# Patient Record
Sex: Male | Born: 1950 | Race: White | Hispanic: No | Marital: Married | Smoking: Never smoker
Health system: Southern US, Community
[De-identification: ages and names within clinical notes are randomized; demographics above are authoritative.]

## PROBLEM LIST (undated history)

## (undated) DIAGNOSIS — I2699 Other pulmonary embolism without acute cor pulmonale: Secondary | ICD-10-CM

## (undated) DIAGNOSIS — R350 Frequency of micturition: Secondary | ICD-10-CM

## (undated) DIAGNOSIS — N323 Diverticulum of bladder: Secondary | ICD-10-CM

## (undated) DIAGNOSIS — N19 Unspecified kidney failure: Secondary | ICD-10-CM

## (undated) HISTORY — PX: HERNIA REPAIR: SHX51

## (undated) HISTORY — PX: OTHER SURGICAL HISTORY: SHX169

---

## 1969-08-20 HISTORY — PX: OTHER SURGICAL HISTORY: SHX169

## 1974-08-20 HISTORY — PX: HIP SURGERY: SHX245

## 2003-03-31 ENCOUNTER — Encounter: Payer: Self-pay | Admitting: Family Medicine

## 2003-03-31 ENCOUNTER — Encounter: Admission: RE | Admit: 2003-03-31 | Discharge: 2003-03-31 | Payer: Self-pay | Admitting: Family Medicine

## 2006-07-01 ENCOUNTER — Encounter: Admission: RE | Admit: 2006-07-01 | Discharge: 2006-07-01 | Payer: Self-pay | Admitting: Family Medicine

## 2007-09-25 ENCOUNTER — Ambulatory Visit: Payer: Self-pay | Admitting: Gastroenterology

## 2007-10-10 ENCOUNTER — Ambulatory Visit: Payer: Self-pay | Admitting: Gastroenterology

## 2007-10-10 ENCOUNTER — Encounter: Payer: Self-pay | Admitting: Gastroenterology

## 2008-05-06 ENCOUNTER — Encounter: Admission: RE | Admit: 2008-05-06 | Discharge: 2008-05-06 | Payer: Self-pay | Admitting: Family Medicine

## 2012-05-27 ENCOUNTER — Other Ambulatory Visit: Payer: Self-pay | Admitting: Urology

## 2012-06-04 ENCOUNTER — Encounter (HOSPITAL_BASED_OUTPATIENT_CLINIC_OR_DEPARTMENT_OTHER): Payer: Self-pay | Admitting: *Deleted

## 2012-06-04 NOTE — Progress Notes (Signed)
NPO AFTER MN. ARRIVES AT 0800. NEEDS HG. 

## 2012-06-05 ENCOUNTER — Ambulatory Visit (HOSPITAL_BASED_OUTPATIENT_CLINIC_OR_DEPARTMENT_OTHER)
Admission: RE | Admit: 2012-06-05 | Discharge: 2012-06-05 | Disposition: A | Discharge status: Home / Self Care | Payer: 59 | Source: Ambulatory Visit | Attending: Urology | Admitting: Urology

## 2012-06-05 ENCOUNTER — Ambulatory Visit (HOSPITAL_BASED_OUTPATIENT_CLINIC_OR_DEPARTMENT_OTHER): Payer: 59 | Admitting: Anesthesiology

## 2012-06-05 ENCOUNTER — Encounter (HOSPITAL_BASED_OUTPATIENT_CLINIC_OR_DEPARTMENT_OTHER): Payer: Self-pay | Admitting: *Deleted

## 2012-06-05 ENCOUNTER — Encounter (HOSPITAL_BASED_OUTPATIENT_CLINIC_OR_DEPARTMENT_OTHER): Payer: Self-pay | Admitting: Anesthesiology

## 2012-06-05 ENCOUNTER — Encounter (HOSPITAL_BASED_OUTPATIENT_CLINIC_OR_DEPARTMENT_OTHER)
Admission: RE | Disposition: A | Discharge status: Home / Self Care | Payer: Self-pay | Source: Ambulatory Visit | Attending: Urology

## 2012-06-05 DIAGNOSIS — N183 Chronic kidney disease, stage 3 unspecified: Secondary | ICD-10-CM | POA: Insufficient documentation

## 2012-06-05 DIAGNOSIS — R339 Retention of urine, unspecified: Secondary | ICD-10-CM | POA: Insufficient documentation

## 2012-06-05 DIAGNOSIS — N401 Enlarged prostate with lower urinary tract symptoms: Secondary | ICD-10-CM | POA: Insufficient documentation

## 2012-06-05 DIAGNOSIS — N400 Enlarged prostate without lower urinary tract symptoms: Secondary | ICD-10-CM

## 2012-06-05 DIAGNOSIS — Z79899 Other long term (current) drug therapy: Secondary | ICD-10-CM | POA: Insufficient documentation

## 2012-06-05 HISTORY — PX: CYSTOSCOPY: SHX5120

## 2012-06-05 HISTORY — DX: Frequency of micturition: R35.0

## 2012-06-05 HISTORY — DX: Diverticulum of bladder: N32.3

## 2012-06-05 LAB — BASIC METABOLIC PANEL
BUN: 27 mg/dL — ABNORMAL HIGH (ref 6–23)
Chloride: 104 mEq/L (ref 96–112)
Glucose, Bld: 84 mg/dL (ref 70–99)
Potassium: 3.7 mEq/L (ref 3.5–5.1)

## 2012-06-05 SURGERY — CYSTOSCOPY
Anesthesia: General | Site: Bladder | Wound class: Clean Contaminated

## 2012-06-05 MED ORDER — LIDOCAINE HCL 2 % EX GEL
CUTANEOUS | Status: DC | PRN
  Administered 2012-06-05: 1 via URETHRAL

## 2012-06-05 MED ORDER — DEXAMETHASONE SODIUM PHOSPHATE 4 MG/ML IJ SOLN
INTRAMUSCULAR | Status: DC | PRN
  Administered 2012-06-05: 8 mg via INTRAVENOUS

## 2012-06-05 MED ORDER — LACTATED RINGERS IV SOLN: INTRAVENOUS | Status: DC

## 2012-06-05 MED ORDER — MIDAZOLAM HCL 5 MG/5ML IJ SOLN
INTRAMUSCULAR | Status: DC | PRN
  Administered 2012-06-05: 2 mg via INTRAVENOUS

## 2012-06-05 MED ORDER — LIDOCAINE HCL (CARDIAC) 20 MG/ML IV SOLN
INTRAVENOUS | Status: DC | PRN
  Administered 2012-06-05: 80 mg via INTRAVENOUS

## 2012-06-05 MED ORDER — DIATRIZOATE MEGLUMINE 30 % UR SOLN
URETHRAL | Status: DC | PRN
  Administered 2012-06-05: 300 mL via URETHRAL

## 2012-06-05 MED ORDER — CEFAZOLIN SODIUM 1-5 GM-% IV SOLN: 1.0000 g | INTRAVENOUS | Status: DC

## 2012-06-05 MED ORDER — BELLADONNA ALKALOIDS-OPIUM 16.2-60 MG RE SUPP
RECTAL | Status: DC | PRN
  Administered 2012-06-05: 1 via RECTAL

## 2012-06-05 MED ORDER — STERILE WATER FOR IRRIGATION IR SOLN
Status: DC | PRN
  Administered 2012-06-05: 3000 mL

## 2012-06-05 MED ORDER — ONDANSETRON HCL 4 MG/2ML IJ SOLN
INTRAMUSCULAR | Status: DC | PRN
  Administered 2012-06-05: 4 mg via INTRAVENOUS

## 2012-06-05 MED ORDER — FENTANYL CITRATE 0.05 MG/ML IJ SOLN
INTRAMUSCULAR | Status: DC | PRN
  Administered 2012-06-05: 50 ug via INTRAVENOUS
  Administered 2012-06-05 (×2): 25 ug via INTRAVENOUS

## 2012-06-05 MED ORDER — PROPOFOL 10 MG/ML IV BOLUS
INTRAVENOUS | Status: DC | PRN
  Administered 2012-06-05: 200 mg via INTRAVENOUS

## 2012-06-05 MED ORDER — HYDROMORPHONE HCL PF 1 MG/ML IJ SOLN: 0.2500 mg | INTRAMUSCULAR | Status: DC | PRN

## 2012-06-05 MED ORDER — LACTATED RINGERS IV SOLN
INTRAVENOUS | Status: DC
  Administered 2012-06-05 (×2): via INTRAVENOUS

## 2012-06-05 MED ORDER — PROMETHAZINE HCL 25 MG/ML IJ SOLN: 6.2500 mg | INTRAMUSCULAR | Status: DC | PRN

## 2012-06-05 MED ORDER — CEFAZOLIN SODIUM-DEXTROSE 2-3 GM-% IV SOLR
2.0000 g | INTRAVENOUS | Status: AC
  Administered 2012-06-05: 2 g via INTRAVENOUS

## 2012-06-05 SURGICAL SUPPLY — 15 items
BAG DRAIN URO-CYSTO SKYTR STRL (DRAIN) ×2 IMPLANT
BAG DRN UROCATH (DRAIN) ×1
BOOTIES KNEE HIGH SLOAN (MISCELLANEOUS) ×2 IMPLANT
CANISTER SUCT LVC 12 LTR MEDI- (MISCELLANEOUS) ×1 IMPLANT
CLOTH BEACON ORANGE TIMEOUT ST (SAFETY) ×2 IMPLANT
DRAPE CAMERA CLOSED 9X96 (DRAPES) ×2 IMPLANT
ELECT REM PT RETURN 9FT ADLT (ELECTROSURGICAL) ×2
ELECTRODE REM PT RTRN 9FT ADLT (ELECTROSURGICAL) ×1 IMPLANT
GLOVE BIO SURGEON STRL SZ7.5 (GLOVE) ×2 IMPLANT
GLOVE INDICATOR 6.5 STRL GRN (GLOVE) ×2 IMPLANT
GOWN PREVENTION PLUS LG XLONG (DISPOSABLE) ×2 IMPLANT
GOWN STRL REIN XL XLG (GOWN DISPOSABLE) ×2 IMPLANT
PACK CYSTOSCOPY (CUSTOM PROCEDURE TRAY) ×2 IMPLANT
SYR BULB IRRIGATION 50ML (SYRINGE) ×1 IMPLANT
WATER STERILE IRR 3000ML UROMA (IV SOLUTION) ×2 IMPLANT

## 2012-06-05 NOTE — Anesthesia Preprocedure Evaluation (Signed)
Anesthesia Evaluation  Patient identified by MRN, date of birth, ID band Patient awake    Reviewed: Allergy & Precautions, H&P , NPO status , Patient's Chart, lab work & pertinent test results  Airway Mallampati: II TM Distance: >3 FB Neck ROM: Full    Dental  (+) Teeth Intact and Dental Advisory Given   Pulmonary neg pulmonary ROS,  breath sounds clear to auscultation  Pulmonary exam normal       Cardiovascular negative cardio ROS  Rhythm:Regular Rate:Normal     Neuro/Psych negative neurological ROS  negative psych ROS   GI/Hepatic negative GI ROS, Neg liver ROS,   Endo/Other  negative endocrine ROS  Renal/GU negative Renal ROS  negative genitourinary   Musculoskeletal negative musculoskeletal ROS (+)   Abdominal   Peds  Hematology negative hematology ROS (+)   Anesthesia Other Findings   Reproductive/Obstetrics negative OB ROS                           Anesthesia Physical Anesthesia Plan  ASA: I  Anesthesia Plan: General   Post-op Pain Management:    Induction: Intravenous  Airway Management Planned: LMA  Additional Equipment:   Intra-op Plan:   Post-operative Plan: Extubation in OR  Informed Consent: I have reviewed the patients History and Physical, chart, labs and discussed the procedure including the risks, benefits and alternatives for the proposed anesthesia with the patient or authorized representative who has indicated his/her understanding and acceptance.   Dental advisory given  Plan Discussed with: CRNA  Anesthesia Plan Comments:         Anesthesia Quick Evaluation  

## 2012-06-05 NOTE — Anesthesia Postprocedure Evaluation (Signed)
Anesthesia Post Note  Patient: Brian Kemp  Procedure(s) Performed: Procedure(s) (LRB): CYSTOSCOPY (N/A) CYSTOGRAM (N/A)  Anesthesia type: General  Patient location: PACU  Post pain: Pain level controlled  Post assessment: Post-op Vital signs reviewed  Last Vitals:  Filed Vitals:   06/05/12 1145  BP: 160/80  Pulse: 50  Temp: 36.1 C  Resp: 18    Post vital signs: Reviewed  Level of consciousness: sedated  Complications: No apparent anesthesia complications

## 2012-06-05 NOTE — Anesthesia Procedure Notes (Signed)
Procedure Name: LMA Insertion Date/Time: 06/05/2012 9:26 AM Performed by: Norva Pavlov Pre-anesthesia Checklist: Patient identified, Emergency Drugs available, Suction available and Patient being monitored Patient Re-evaluated:Patient Re-evaluated prior to inductionOxygen Delivery Method: Circle System Utilized Preoxygenation: Pre-oxygenation with 100% oxygen Intubation Type: IV induction Ventilation: Mask ventilation without difficulty LMA: LMA inserted LMA Size: 4.0 Number of attempts: 1 Airway Equipment and Method: bite block Placement Confirmation: positive ETCO2 Tube secured with: Tape Dental Injury: Teeth and Oropharynx as per pre-operative assessment

## 2012-06-05 NOTE — Transfer of Care (Signed)
Immediate Anesthesia Transfer of Care Note  Patient: Brian Kemp  Procedure(s) Performed: Procedure(s) (LRB): CYSTOSCOPY (N/A) CYSTOGRAM (N/A)  Patient Location: PACU  Anesthesia Type: General  Level of Consciousness: awake, alert  and oriented  Airway & Oxygen Therapy: Patient Spontanous Breathing and Patient connected to face mask oxygen  Post-op Assessment: Report given to PACU RN and Post -op Vital signs reviewed and stable  Post vital signs: Reviewed and stable  Complications: No apparent anesthesia complications

## 2012-06-05 NOTE — Interval H&P Note (Signed)
History and Physical Interval Note:  06/05/2012 9:10 AM  Brian Kemp  has presented today for surgery, with the diagnosis of BILATERAL DIVERTICULI  The various methods of treatment have been discussed with the patient and family. After consideration of risks, benefits and other options for treatment, the patient has consented to  Procedure(s) (LRB) with comments: CYSTOSCOPY (N/A) - C-ARM  CYSTOGRAM (N/A) as a surgical intervention .  The patient's history has been reviewed, patient examined, no change in status, stable for surgery.  I have reviewed the patient's chart and labs.  Questions were answered to the patient's satisfaction.     [name]

## 2012-06-05 NOTE — H&P (Signed)
cc:  Dr. Fried   Reason For Visit     Cystoscopy, PUS, RUS & to review urodynamic results   Active Problems Problems  1. 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 Chronic Kidney Disease, Stage 3 585.3 3. Incomplete Emptying Of Bladder 788.21  History of Present Illness           Brian Kemp is a 61-year-old male returns today for cystoscopy, PUS, RUS & to review urodynamic results.     Hx of BPH, incomplete emptying, urgency, frequency, urinary straining, weak flow, and nocturia x3 with symptoms for 15 years. He has an international prostate symptom score= 25 (normal 7).  He also has enuresis. He has previously taken finasteride, but stopped because of side effects ( breast development). He has a creatinine of 1.94, and stage III chronic kidney disease. Rectal examination showed normal sphincter tone with prostate 3+ in size, and tone is normal.  Urodynamics on 05/13/12, shows maximum cystometric capacity of 900 cc. First sensation is at 561 cc, normal desire at 602 cc, strong desire at 707 cc. The bladder has a 14 cm H20contraction pressure,  with no urinary leakage.     VCUG shows that there is a voluntary contraction,  with a voided volume of 211cc, and a maximum flow rate of 9 cc per second. Detrusor pressure at maximum flow was 123 cm H20 water. Maximum detrusor pressure is 124 cm H20. PVR is 689 cc. The patient voided both sitting and standing. He has an obstructive flow pattern.  The patient has trabeculation, bilateral bladder diverticuli. He has a large, hyposensitive detrusor with low amplitude instability at high volumes.   Past Medical History Problems  1. History of  No Medical Problems  Surgical History Problems  1. History of  Eye Surgery Right 2. History of  Hernia Repair 3. History of  Hip Surgery Left 4. History of  Knee Surgery Left 5. History of  Knee Surgery Left  Current Meds 1. Aleve TABS; TAKE TABLET  PRN; Therapy:  (Recorded:17Sep2013) to 2. Ciprofloxacin HCl 500 MG Oral Tablet; TAKE 1 TABLET BID; Therapy: 18Sep2013 to (Last  Rx:02Oct2013)  Requested for: 02Oct2013 3. Multi Vitamin/Minerals TABS; Therapy: (Recorded:17Sep2013) to 4. Tamsulosin HCl 0.4 MG Oral Capsule; Therapy: (Recorded:17Sep2013) to  Allergies Medication  1. No Known Drug Allergies  Family History Problems  1. Paternal history of  Alzheimer's Disease 2. Paternal history of  Cancer 3. Family history of  Death In The Family Father 4. Family history of  Family Health Status Number Of Children 1 daughter  Social History Problems  1. Alcohol Use 1 per month 2. Caffeine Use 2-3 per day 3. Marital History - Divorced V61.03 4. Never A Smoker 5. Retired From Work  Review of Systems Genitourinary, constitutional, skin, eye, otolaryngeal, hematologic/lymphatic, cardiovascular, pulmonary, endocrine, musculoskeletal, gastrointestinal, neurological and psychiatric system(s) were reviewed and pertinent findings if present are noted.  Genitourinary: urinary frequency, feelings of urinary urgency, nocturia, weak urinary stream, urinary stream starts and stops, incomplete emptying of bladder and initiating urination requires straining and enuresis.  Musculoskeletal: joint pain.    Vitals Vital Signs [Data Includes: Last 1 Day]  07Oct2013 04:12PM  Blood Pressure: 141 / 91 Temperature: 97.5 F Heart Rate: 59  Results/Data Urine [Data Includes: Last 1 Day]   07Oct2013  COLOR YELLOW   APPEARANCE CLOUDY   SPECIFIC GRAVITY 1.015   pH 5.5   GLUCOSE NEG mg/dL  BILIRUBIN NEG   KETONE NEG mg/dL  BLOOD MOD     PROTEIN NEG mg/dL  UROBILINOGEN 0.2 mg/dL  NITRITE NEG   LEUKOCYTE ESTERASE LARGE   SQUAMOUS EPITHELIAL/HPF NONE SEEN   WBC TNTC WBC/hpf  RBC 7-10 RBC/hpf  BACTERIA FEW   CRYSTALS NONE SEEN   CASTS NONE SEEN   Other MUCUS PRESENT   Selected Results  URINE CULTURE 24Sep2013 11:13AM Freeda Spivey  Source :  CATHED SPECIMEN TYPE: CATH URINE   Test Name Result Flag Reference  CULTURE, URINE Culture, Urine    ===== COLONY COUNT: =====  NO GROWTH   FINAL REPORT: NO GROWTH     Renal u/s: R: 11.09cm wiht cortex 1.38cm and L: 10.82cm wiht cortex 1.18cm. MODERATE BILATERAL HYDRONEPHROSIS. No stones. No tumor. PVR estimate= 426.53cc. 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  Procedure  Procedure: Cystoscopy   Indication: Lower Urinary Tract Symptoms. Frequent UTI.  Informed Consent: Risks, benefits, and potential adverse events were discussed and informed consent was obtained from the patient.  Prep: The patient was prepped with betadine.  Anesthesia:. Local anesthesia was administered intraurethrally with 2% lidocaine jelly.  Antibiotic prophylaxis: Ciprofloxacin.  Procedure Note:  Anterior urethra: No abnormalities.  Prostatic urethra: No abnormalities . The lateral prostatic lobes were enlarged. No intravesical median lobe was visualized.  Bladder: Visulization was obscured due to cloudy urine. The ureteral orifices were not able to be identified. Examination of the bladder demonstrated trabeculation and a diverticulum located on the posterior aspect, on the left side of the bladder measuring approximately 4 cm bilateral posterior, large, but no clot within the bladder no fistula and no cellules. The patient tolerated the procedure well.  Complications: None.    Assessment Assessed  1. 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 Chronic Kidney Disease, Stage 3 585.3 3. Incomplete Emptying Of Bladder 788.21         61 yo male with bladder outlet symptoms since age 44, with bilateral bladder tics on VCUG, ultrasound, and on cystoscopy; and bilateral hydronephrosis, CKD 3, with elevated Cr. 1.46, and pvr = 426cc. Prostate volume is  39.58cc. I do not think the siae of the prostate is the problem; rather I think he needs cysto under anesthesia with cystogram to look for reflux,  and will present to Dr. MacDiarmid to consider value of bilateral diverticulectomy, but leave prostate alone. He has bilateral hydro, and this may be why he has ckd. ? need for bilateral reflux surgery at the same time. Concern is for high detrussor pressure as cause for CKD.   Plan      Creatinine, cysto and cystogram.   Amendment     prostate u/s: 39.58cc. Renal u/s: R: 11.00 iwth cortex 1.38cm, L: 10.82 and coretx 1.18cm. Bilateral hydrl. pvr> 426.53cc   Signatures Electronically signed by : Carizma Dunsworth, M.D.; May 27 2012  1:51PM  

## 2012-06-05 NOTE — Op Note (Signed)
Pre-operative diagnosis : BPH with chronic kidney disease  Postoperative diagnosis: Same  Operation: Cystourethroscopy, cystogram  Surgeon:  S. Patsi Sears, MD  First assistant: None  [id number]}  Preparation: after appropriate preanesthesia, the patient was brought to the operating room, placed on the operating table in armband was double checked. The patient was then replaced in the dorsal lithotomy position with care taken to avoid any injury to the left hip, which is a source of pain for the patient.the dorsal supine position where general LMA anesthesia was introduced.  Review history:Problems  1. Benign Localized Prostatic Hyperplasia With Urinary Obstruction 600.21  2. Chronic Kidney Disease, Stage 3 585.3  3. Incomplete Emptying Of Bladder 788.21  History of Present Illness  Brian Kemp is a 61 year old male returns today for cystoscopy, PUS, RUS & to review urodynamic results.  Hx of BPH, incomplete emptying, urgency, frequency, urinary straining, weak flow, and nocturia x3 with symptoms for 15 years. He has an international prostate symptom score= 25 (normal 7). He also has enuresis. He has previously taken finasteride, but stopped because of side effects ( breast development). He has a creatinine of 1.94, and stage III chronic kidney disease. Rectal examination showed normal sphincter tone with prostate 3+ in size, and tone is normal.  Urodynamics on 05/13/12, shows maximum cystometric capacity of 900 cc. First sensation is at 561 cc, normal desire at 602 cc, strong desire at 707 cc. The bladder has a 14 cm H20contraction pressure, with no urinary leakage.  VCUG shows that there is a voluntary contraction, with a voided volume of 211cc, and a maximum flow rate of 9 cc per second. Detrusor pressure at maximum flow was 123 cm H20 water. Maximum detrusor pressure is 124 cm H20. PVR is 689 cc. The patient voided both sitting and standing. He has an obstructive flow pattern.  The  patient has trabeculation, bilateral bladder diverticuli. He has a large, hyposensitive detrusor with low amplitude instability at high volumes.   Statement of  Likelihood of success: Good    Procedure:   Cystourethroscopy was accomplished, which showed normal urethral meatus, and normal pendulous urethra. The proximal urethra showed normal bulbar urethra, normal Vero, and bilobar BPH, with normal bladder neck. The bladder base showed a normal trigone, with normal ureteral orifice these. There was no evidence of bladder stone tumor. There were bilateral diverticuli identified, at the lateral bladder base, bilaterally. Further cystoscopy revealed large distended bladder, with overstretched detrusor. Trabeculation and cellule formation were identified.     Cystogram showed no evidence of reflux bilaterally.     The bladder is drained of fluid, and again, no reflux was identified. The bilateral diverticuli were easily identified.     Xylocaine jelly was placed in the urethra, and the patient was awakened, and taken to recovery room in good condition.

## 2012-06-06 ENCOUNTER — Encounter (HOSPITAL_BASED_OUTPATIENT_CLINIC_OR_DEPARTMENT_OTHER): Payer: Self-pay | Admitting: Urology

## 2012-06-25 ENCOUNTER — Other Ambulatory Visit: Payer: Self-pay | Admitting: Urology

## 2012-07-02 ENCOUNTER — Encounter (HOSPITAL_BASED_OUTPATIENT_CLINIC_OR_DEPARTMENT_OTHER): Payer: Self-pay | Admitting: *Deleted

## 2012-07-02 NOTE — Progress Notes (Signed)
NPO AFTER MN. ARRIVES AT 0945. NEEDS HG. 

## 2012-07-07 ENCOUNTER — Ambulatory Visit (HOSPITAL_BASED_OUTPATIENT_CLINIC_OR_DEPARTMENT_OTHER): Payer: 59 | Admitting: Anesthesiology

## 2012-07-07 ENCOUNTER — Encounter (HOSPITAL_BASED_OUTPATIENT_CLINIC_OR_DEPARTMENT_OTHER): Payer: Self-pay | Admitting: Anesthesiology

## 2012-07-07 ENCOUNTER — Encounter (HOSPITAL_BASED_OUTPATIENT_CLINIC_OR_DEPARTMENT_OTHER): Payer: Self-pay | Admitting: *Deleted

## 2012-07-07 ENCOUNTER — Ambulatory Visit (HOSPITAL_BASED_OUTPATIENT_CLINIC_OR_DEPARTMENT_OTHER)
Admission: RE | Admit: 2012-07-07 | Discharge: 2012-07-07 | Disposition: A | Discharge status: Home / Self Care | Payer: 59 | Source: Ambulatory Visit | Attending: Urology | Admitting: Urology

## 2012-07-07 ENCOUNTER — Encounter (HOSPITAL_BASED_OUTPATIENT_CLINIC_OR_DEPARTMENT_OTHER)
Admission: RE | Disposition: A | Discharge status: Home / Self Care | Payer: Self-pay | Source: Ambulatory Visit | Attending: Urology

## 2012-07-07 DIAGNOSIS — N138 Other obstructive and reflux uropathy: Secondary | ICD-10-CM | POA: Insufficient documentation

## 2012-07-07 DIAGNOSIS — N320 Bladder-neck obstruction: Secondary | ICD-10-CM | POA: Insufficient documentation

## 2012-07-07 DIAGNOSIS — N323 Diverticulum of bladder: Secondary | ICD-10-CM | POA: Insufficient documentation

## 2012-07-07 DIAGNOSIS — N401 Enlarged prostate with lower urinary tract symptoms: Secondary | ICD-10-CM | POA: Insufficient documentation

## 2012-07-07 DIAGNOSIS — N183 Chronic kidney disease, stage 3 unspecified: Secondary | ICD-10-CM | POA: Insufficient documentation

## 2012-07-07 DIAGNOSIS — R339 Retention of urine, unspecified: Secondary | ICD-10-CM | POA: Insufficient documentation

## 2012-07-07 DIAGNOSIS — Z79899 Other long term (current) drug therapy: Secondary | ICD-10-CM | POA: Insufficient documentation

## 2012-07-07 HISTORY — PX: TRANSURETHRAL RESECTION OF PROSTATE: SHX73

## 2012-07-07 SURGERY — TRANSURETHRAL RESECTION OF THE PROSTATE WITH GYRUS INSTRUMENTS
Anesthesia: General | Site: Prostate | Wound class: Clean Contaminated

## 2012-07-07 MED ORDER — LIDOCAINE HCL (CARDIAC) 20 MG/ML IV SOLN
INTRAVENOUS | Status: DC | PRN
  Administered 2012-07-07: 80 mg via INTRAVENOUS

## 2012-07-07 MED ORDER — DEXAMETHASONE SODIUM PHOSPHATE 4 MG/ML IJ SOLN
INTRAMUSCULAR | Status: DC | PRN
  Administered 2012-07-07: 10 mg via INTRAVENOUS

## 2012-07-07 MED ORDER — BELLADONNA ALKALOIDS-OPIUM 16.2-60 MG RE SUPP
RECTAL | Status: DC | PRN
Start: 2012-07-07 — End: 2012-07-07
  Administered 2012-07-07: 1 via RECTAL

## 2012-07-07 MED ORDER — PROMETHAZINE HCL 25 MG/ML IJ SOLN
6.2500 mg | INTRAMUSCULAR | Status: DC | PRN
  Filled 2012-07-07: qty 1

## 2012-07-07 MED ORDER — KETOROLAC TROMETHAMINE 30 MG/ML IJ SOLN
INTRAMUSCULAR | Status: DC | PRN
  Administered 2012-07-07: 15 mg via INTRAVENOUS

## 2012-07-07 MED ORDER — ONDANSETRON HCL 4 MG/2ML IJ SOLN
INTRAMUSCULAR | Status: DC | PRN
  Administered 2012-07-07: 4 mg via INTRAVENOUS

## 2012-07-07 MED ORDER — URELLE 81 MG PO TABS: 1.0000 | ORAL_TABLET | Freq: Three times a day (TID) | ORAL | Status: DC

## 2012-07-07 MED ORDER — TRIMETHOPRIM 100 MG PO TABS: 100.0000 mg | ORAL_TABLET | ORAL | Status: DC

## 2012-07-07 MED ORDER — ACETAMINOPHEN 10 MG/ML IV SOLN
INTRAVENOUS | Status: DC | PRN
  Administered 2012-07-07: 1000 mg via INTRAVENOUS

## 2012-07-07 MED ORDER — PROPOFOL 10 MG/ML IV BOLUS
INTRAVENOUS | Status: DC | PRN
  Administered 2012-07-07: 50 mg via INTRAVENOUS
  Administered 2012-07-07: 200 mg via INTRAVENOUS

## 2012-07-07 MED ORDER — OXYCODONE HCL 5 MG PO TABS
5.0000 mg | ORAL_TABLET | ORAL | Status: DC | PRN
  Administered 2012-07-07: 5 mg via ORAL
  Filled 2012-07-07: qty 1

## 2012-07-07 MED ORDER — FENTANYL CITRATE 0.05 MG/ML IJ SOLN
INTRAMUSCULAR | Status: DC | PRN
  Administered 2012-07-07 (×3): 50 ug via INTRAVENOUS
  Administered 2012-07-07: 25 ug via INTRAVENOUS
  Administered 2012-07-07: 50 ug via INTRAVENOUS
  Administered 2012-07-07: 25 ug via INTRAVENOUS
  Administered 2012-07-07: 50 ug via INTRAVENOUS

## 2012-07-07 MED ORDER — MIDAZOLAM HCL 5 MG/5ML IJ SOLN
INTRAMUSCULAR | Status: DC | PRN
  Administered 2012-07-07: 2 mg via INTRAVENOUS

## 2012-07-07 MED ORDER — LACTATED RINGERS IV SOLN
INTRAVENOUS | Status: DC
  Filled 2012-07-07: qty 1000

## 2012-07-07 MED ORDER — CEFAZOLIN SODIUM 1-5 GM-% IV SOLN
1.0000 g | INTRAVENOUS | Status: DC
  Filled 2012-07-07: qty 50

## 2012-07-07 MED ORDER — HYDROMORPHONE HCL PF 1 MG/ML IJ SOLN
0.2500 mg | INTRAMUSCULAR | Status: DC | PRN
  Filled 2012-07-07: qty 1

## 2012-07-07 MED ORDER — CEFAZOLIN SODIUM-DEXTROSE 2-3 GM-% IV SOLR
2.0000 g | INTRAVENOUS | Status: AC
  Administered 2012-07-07: 2 g via INTRAVENOUS
  Filled 2012-07-07: qty 50

## 2012-07-07 MED ORDER — SODIUM CHLORIDE 0.9 % IR SOLN
Status: DC | PRN
  Administered 2012-07-07: 12000 mL

## 2012-07-07 MED ORDER — LACTATED RINGERS IV SOLN
INTRAVENOUS | Status: DC
  Administered 2012-07-07 (×2): via INTRAVENOUS
  Filled 2012-07-07: qty 1000

## 2012-07-07 MED ORDER — INDIGOTINDISULFONATE SODIUM 8 MG/ML IJ SOLN
INTRAMUSCULAR | Status: DC | PRN
  Administered 2012-07-07: 5 mL via INTRAVENOUS

## 2012-07-07 SURGICAL SUPPLY — 27 items
BAG DRAIN URO-CYSTO SKYTR STRL (DRAIN) ×2 IMPLANT
BAG DRN UROCATH (DRAIN) ×1
BAG URINE DRAINAGE (UROLOGICAL SUPPLIES) ×1 IMPLANT
BOOTIES KNEE HIGH SLOAN (MISCELLANEOUS) ×2 IMPLANT
CANISTER SUCT LVC 12 LTR MEDI- (MISCELLANEOUS) ×6 IMPLANT
CATH HEMA 3WAY 30CC 24FR COUDE (CATHETERS) ×1 IMPLANT
CATH HEMA 3WAY 30CC 24FR RND (CATHETERS) IMPLANT
CLOTH BEACON ORANGE TIMEOUT ST (SAFETY) ×2 IMPLANT
DRAPE CAMERA CLOSED 9X96 (DRAPES) ×2 IMPLANT
ELECT LOOP HF 24-28F (CUTTING LOOP) IMPLANT
ELECT LOOP HF 26F 30D .35MM (CUTTING LOOP) IMPLANT
ELECT LOOP MED HF 24F 12D CBL (CLIP) ×2 IMPLANT
ELECT NEEDLE 45D HF 24-28F 12D (CUTTING LOOP) IMPLANT
ELECT REM PT RETURN 9FT ADLT (ELECTROSURGICAL) ×2
ELECTRODE REM PT RTRN 9FT ADLT (ELECTROSURGICAL) ×1 IMPLANT
GLOVE BIO SURGEON STRL SZ7.5 (GLOVE) ×2 IMPLANT
GOWN PREVENTION PLUS LG XLONG (DISPOSABLE) ×2 IMPLANT
GOWN STRL REIN XL XLG (GOWN DISPOSABLE) ×3 IMPLANT
HOLDER FOLEY CATH W/STRAP (MISCELLANEOUS) ×1 IMPLANT
IV NS IRRIG 3000ML ARTHROMATIC (IV SOLUTION) ×5 IMPLANT
KIT ASPIRATION TUBING (SET/KITS/TRAYS/PACK) ×2 IMPLANT
LOOP CUTTING 24FR OLYMPUS (CUTTING LOOP) IMPLANT
PACK CYSTOSCOPY (CUSTOM PROCEDURE TRAY) ×2 IMPLANT
PLUG CATH AND CAP STER (CATHETERS) IMPLANT
SET ASPIRATION TUBING (TUBING) IMPLANT
SYR 30ML LL (SYRINGE) IMPLANT
SYRINGE IRR TOOMEY STRL 70CC (SYRINGE) ×2 IMPLANT

## 2012-07-07 NOTE — Anesthesia Postprocedure Evaluation (Signed)
Anesthesia Post Note  Patient: Brian Kemp  Procedure(s) Performed: Procedure(s) (LRB): TRANSURETHRAL RESECTION OF THE PROSTATE WITH GYRUS INSTRUMENTS (N/A)  Anesthesia type: General  Patient location: PACU  Post pain: Pain level controlled  Post assessment: Post-op Vital signs reviewed  Last Vitals: BP 137/107  Pulse 75  Temp 36 C (Oral)  Resp 23  Ht 5\' 10"  (1.778 m)  Wt 193 lb 5 oz (87.686 kg)  BMI 27.74 kg/m2  SpO2 100%  Post vital signs: Reviewed  Level of consciousness: sedated  Complications: No apparent anesthesia complications

## 2012-07-07 NOTE — H&P (Signed)
cc:  Dr. Foy [place]   Reason For Visit     Cystoscopy, PUS, RUS & to review urodynamic results   Active Problems Problems  1. 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 Chronic Kidney Disease, Stage 3 585.3 3. Incomplete Emptying Of Bladder 788.21  History of Present Illness           Brian Kemp is a 61 year old male returns today for cystoscopy, PUS, RUS & to review urodynamic results.     Hx of BPH, incomplete emptying, urgency, frequency, urinary straining, weak flow, and nocturia x3 with symptoms for 15 years. He has an international prostate symptom score= 25 (normal 7).  He also has enuresis. He has previously taken finasteride, but stopped because of side effects ( breast development). He has a creatinine of 1.94, and stage III chronic kidney disease. Rectal examination showed normal sphincter tone with prostate 3+ in size, and tone is normal.  Urodynamics on 05/13/12, shows maximum cystometric capacity of 900 cc. First sensation is at 561 cc, normal desire at 602 cc, strong desire at 707 cc. The bladder has a 14 cm H20contraction pressure,  with no urinary leakage.     VCUG shows that there is a voluntary contraction,  with a voided volume of 211cc, and a maximum flow rate of 9 cc per second. Detrusor pressure at maximum flow was 123 cm H20 water. Maximum detrusor pressure is 124 cm H20. PVR is 689 cc. The patient voided both sitting and standing. He has an obstructive flow pattern.  The patient has trabeculation, bilateral bladder diverticuli. He has a large, hyposensitive detrusor with low amplitude instability at high volumes.   Past Medical History Problems  1. History of  No Medical Problems  Surgical History Problems  1. History of  Eye Surgery Right 2. History of  Hernia Repair 3. History of  Hip Surgery Left 4. History of  Knee Surgery Left 5. History of  Knee Surgery Left  Current Meds 1. Aleve TABS; TAKE TABLET  PRN; Therapy:  (Recorded:17Sep2013) to 2. Ciprofloxacin HCl 500 MG Oral Tablet; TAKE 1 TABLET BID; Therapy: 18Sep2013 to (Last  Rx:02Oct2013)  Requested for: 02Oct2013 3. Multi Vitamin/Minerals TABS; Therapy: (Recorded:17Sep2013) to 4. Tamsulosin HCl 0.4 MG Oral Capsule; Therapy: (Recorded:17Sep2013) to  Allergies Medication  1. No Known Drug Allergies  Family History Problems  1. Paternal history of  Alzheimer's Disease 2. Paternal history of  Cancer 3. Family history of  Death In The Family Father 4. Family history of  Family Health Status Number Of Children 1 daughter  Social History Problems  1. Alcohol Use 1 per month 2. Caffeine Use 2-3 per day 3. Marital History - Divorced V61.03 4. Never A Smoker 5. Retired From Work  Review of [employer], constitutional, skin, eye, otolaryngeal, hematologic/lymphatic, cardiovascular, pulmonary, endocrine, musculoskeletal, gastrointestinal, neurological and psychiatric system(s) were reviewed and pertinent findings if present are noted.  Genitourinary: urinary frequency, feelings of urinary urgency, nocturia, weak urinary stream, urinary stream starts and stops, incomplete emptying of bladder and initiating urination requires straining and enuresis.  Musculoskeletal: joint pain.    Vitals Vital Signs [Data Includes: Last 1 Day]  07Oct2013 04:12PM  Blood Pressure: 141 / 91 Temperature: 97.5 F Heart Rate: 59  Results/Data Urine [Data Includes: Last 1 Day]   07Oct2013  COLOR YELLOW   APPEARANCE CLOUDY   SPECIFIC GRAVITY 1.015   pH 5.5   GLUCOSE NEG mg/dL  BILIRUBIN NEG   KETONE NEG mg/dL  BLOOD MOD  PROTEIN NEG mg/dL  UROBILINOGEN 0.2 mg/dL  NITRITE NEG   LEUKOCYTE ESTERASE LARGE   SQUAMOUS EPITHELIAL/HPF NONE SEEN   WBC TNTC WBC/hpf  RBC 7-10 RBC/hpf  BACTERIA FEW   CRYSTALS NONE SEEN   CASTS NONE SEEN   Other MUCUS PRESENT   Selected Results  URINE CULTURE 24Sep2013 11:13AM Jethro Bolus  Source :  CATHED SPECIMEN TYPE: CATH URINE   Test Name Result Flag Reference  CULTURE, URINE Culture, Urine    ===== COLONY COUNT: =====  NO GROWTH   FINAL REPORT: NO GROWTH     Renal u/s: R: 11.09cm wiht cortex 1.38cm and L: 10.82cm wiht cortex 1.18cm. MODERATE BILATERAL HYDRONEPHROSIS. No stones. No tumor. PVR estimate= 426.53cc. 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  Procedure  Procedure: Cystoscopy   Indication: Lower Urinary Tract Symptoms. Frequent UTI.  Informed Consent: Risks, benefits, and potential adverse events were discussed and informed consent was obtained from the patient.  Prep: The patient was prepped with betadine.  Anesthesia:. Local anesthesia was administered intraurethrally with 2% lidocaine jelly.  Antibiotic prophylaxis: Ciprofloxacin.  Procedure Note:  Anterior urethra: No abnormalities.  Prostatic urethra: No abnormalities . The lateral prostatic lobes were enlarged. No intravesical median lobe was visualized.  Bladder: Visulization was obscured due to cloudy urine. The ureteral orifices were not able to be identified. Examination of the bladder demonstrated trabeculation and a diverticulum located on the posterior aspect, on the left side of the bladder measuring approximately 4 cm bilateral posterior, large, but no clot within the bladder no fistula and no cellules. The patient tolerated the procedure well.  Complications: None.    Assessment Assessed  1. 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 Chronic Kidney Disease, Stage 3 585.3 3. Incomplete Emptying Of Bladder 47.21         61 yo male with bladder outlet symptoms since age 89, with bilateral bladder tics on VCUG, ultrasound, and on cystoscopy; and bilateral hydronephrosis, CKD 3, with elevated Cr. 1.46, and pvr = 426cc. Prostate volume is  39.58cc. I do not think the siae of the prostate is the problem; rather I think he needs cysto under anesthesia with cystogram to look for reflux,  and will present to Dr. Sherron Monday to consider value of bilateral diverticulectomy, but leave prostate alone. He has bilateral hydro, and this may be why he has ckd. ? need for bilateral reflux surgery at the same time. Concern is for high detrussor pressure as cause for CKD.   Plan      Creatinine, cysto and cystogram.   Amendment     prostate u/s: 39.58cc. Renal u/s: R: 11.00 iwth cortex 1.38cm, L: 10.82 and coretx 1.18cm. Bilateral hydrl. pvr> 426.53cc   Signatures Electronically signed by : Jethro Bolus, M.D.; May 27 2012  1:51PM

## 2012-07-07 NOTE — Op Note (Signed)
Pre-operative diagnosis : BPH  Postoperative diagnosis:Same  Operation:Cystoscopy, TURP  Surgeon:  S. Patsi Sears, MD  First assistant:NONE  Anesthesia:  general  Preparation: After appropriate preanesthesia, the patient was brought to the operating room, and placed upon the operating table in the dorsal supine position where general LMA anesthesia was introduced. He was replaced in the dorsal lithotomy position with pubis was prepped with Betadine solution and draped in usual fashion. The arm band was double checked.  Review history:Brian Kemp is a 61 year old male returns today for cystoscopy, PUS, RUS & to review urodynamic results.  Hx of BPH, incomplete emptying, urgency, frequency, urinary straining, weak flow, and nocturia x3 with symptoms for 15 years. He has an international prostate symptom score= 25 (normal 7). He also has enuresis. He has previously taken finasteride, but stopped because of side effects ( breast development). He has a creatinine of 1.94, and stage III chronic kidney disease. Rectal examination showed normal sphincter tone with prostate 3+ in size, and tone is normal.  Urodynamics on 05/13/12, shows maximum cystometric capacity of 900 cc. First sensation is at 561 cc, normal desire at 602 cc, strong desire at 707 cc. The bladder has a 14 cm H20contraction pressure, with no urinary leakage.  VCUG shows that there is a voluntary contraction, with a voided volume of 211cc, and a maximum flow rate of 9 cc per second. Detrusor pressure at maximum flow was 123 cm H20 water. Maximum detrusor pressure is 124 cm H20. PVR is 689 cc. The patient voided both sitting and standing. He has an obstructive flow pattern.  The patient has trabeculation, bilateral bladder diverticuli. He has a large, hyposensitive detrusor with low amplitude instability at high volumes.      Statement of  Likelihood of Success: Excellent. TIME-OUT observed.:  Procedure: Cystourethroscopy was accomplished,  and showed the patient had normal appearing urethral meatus, and pendulous urethra. The prostate appeared to have trilobar BPH, with enlarged lateral lobe, and moderately elevated bladder neck. The bladder itself had massive trabeculation, and cellule formation, and a large left lower diverticulum. I wasn't able to identify the ureteral orifices. Indigocarmine was given, but no blue contrast was identified from the ureteral orifice these.  TUR resection was begun at the 7:00 position and carried to the 5:00 position, then from the 11:00 to the 6 clock position, and again from the 1:00 to the 5:00 positions. Chips were evacuated free from the bladder, and extensive cauterization was accomplished to ensure that the patient would not bleed postoperatively. A 24 French three-way hematuria catheter was placed with traction. He will have when necessary continuous irrigation, or hand irrigation recovery room. He will plan to go home as an outpatient. He received IV Tylenol, as . The patient was awakened and taken to recovery room in good condition.well as IV Toradol

## 2012-07-07 NOTE — Anesthesia Procedure Notes (Signed)
Procedure Name: LMA Insertion Date/Time: 07/07/2012 11:47 AM Performed by: Norva Pavlov Pre-anesthesia Checklist: Patient identified, Emergency Drugs available, Suction available and Patient being monitored Patient Re-evaluated:Patient Re-evaluated prior to inductionOxygen Delivery Method: Circle System Utilized Preoxygenation: Pre-oxygenation with 100% oxygen Intubation Type: IV induction Ventilation: Mask ventilation without difficulty LMA: LMA inserted LMA Size: 4.0 Number of attempts: 1 Airway Equipment and Method: bite block Placement Confirmation: positive ETCO2 Tube secured with: Tape Dental Injury: Teeth and Oropharynx as per pre-operative assessment

## 2012-07-07 NOTE — Interval H&P Note (Signed)
History and Physical Interval Note:  07/07/2012 11:24 AM  Brian Kemp  has presented today for surgery, with the diagnosis of Benign Prostatic Hyperplasia  The various methods of treatment have been discussed with the patient and family. After consideration of risks, benefits and other options for treatment, the patient has consented to  Procedure(s) (LRB) with comments: TRANSURETHRAL RESECTION OF THE PROSTATE WITH GYRUS INSTRUMENTS (N/A) as a surgical intervention .  The patient's history has been reviewed, patient examined, no change in status, stable for surgery.  I have reviewed the patient's chart and labs.  Questions were answered to the patient's satisfaction.     [name]

## 2012-07-07 NOTE — Anesthesia Preprocedure Evaluation (Signed)
Anesthesia Evaluation  Patient identified by MRN, date of birth, ID band Patient awake    Reviewed: Allergy & Precautions, H&P , NPO status , Patient's Chart, lab work & pertinent test results  Airway Mallampati: II TM Distance: >3 FB Neck ROM: Full    Dental  (+) Teeth Intact and Dental Advisory Given   Pulmonary neg pulmonary ROS,  breath sounds clear to auscultation  Pulmonary exam normal       Cardiovascular negative cardio ROS  Rhythm:Regular Rate:Normal     Neuro/Psych negative neurological ROS  negative psych ROS   GI/Hepatic negative GI ROS, Neg liver ROS,   Endo/Other  negative endocrine ROS  Renal/GU negative Renal ROS  negative genitourinary   Musculoskeletal negative musculoskeletal ROS (+)   Abdominal   Peds  Hematology negative hematology ROS (+)   Anesthesia Other Findings   Reproductive/Obstetrics negative OB ROS                           Anesthesia Physical Anesthesia Plan  ASA: I  Anesthesia Plan: General   Post-op Pain Management:    Induction: Intravenous  Airway Management Planned: LMA  Additional Equipment:   Intra-op Plan:   Post-operative Plan: Extubation in OR  Informed Consent: I have reviewed the patients History and Physical, chart, labs and discussed the procedure including the risks, benefits and alternatives for the proposed anesthesia with the patient or authorized representative who has indicated his/her understanding and acceptance.   Dental advisory given  Plan Discussed with: CRNA  Anesthesia Plan Comments:         Anesthesia Quick Evaluation  

## 2012-07-07 NOTE — Transfer of Care (Addendum)
Immediate Anesthesia Transfer of Care Note  Patient: Brian Kemp  Procedure(s) Performed: Procedure(s) (LRB): TRANSURETHRAL RESECTION OF THE PROSTATE WITH GYRUS INSTRUMENTS (N/A)  Patient Location: PACU  Anesthesia Type: General  Level of Consciousness: drowsy, follows commands, arouses to name  Airway & Oxygen Therapy: Patient Spontanous Breathing and Patient connected to face mask oxygen  Post-op Assessment: Report given to PACU RN and Post -op Vital signs reviewed and stable  Post vital signs: Reviewed and stable  Complications: No apparent anesthesia complications

## 2012-07-08 ENCOUNTER — Encounter (HOSPITAL_BASED_OUTPATIENT_CLINIC_OR_DEPARTMENT_OTHER): Payer: Self-pay | Admitting: Urology

## 2012-07-08 LAB — POCT HEMOGLOBIN-HEMACUE: Hemoglobin: 14 g/dL (ref 13.0–17.0)

## 2012-07-08 NOTE — Progress Notes (Signed)
Had leakage around catheter tubing that has not had before instructed to call doctors office to touch base with him.

## 2012-10-15 ENCOUNTER — Encounter: Payer: Self-pay | Admitting: Gastroenterology

## 2014-12-27 ENCOUNTER — Encounter: Payer: Self-pay | Admitting: Gastroenterology

## 2015-10-13 DIAGNOSIS — E041 Nontoxic single thyroid nodule: Secondary | ICD-10-CM | POA: Diagnosis not present

## 2015-10-26 DIAGNOSIS — D497 Neoplasm of unspecified behavior of endocrine glands and other parts of nervous system: Secondary | ICD-10-CM | POA: Insufficient documentation

## 2015-10-26 DIAGNOSIS — E041 Nontoxic single thyroid nodule: Secondary | ICD-10-CM | POA: Diagnosis not present

## 2016-01-13 ENCOUNTER — Encounter (HOSPITAL_COMMUNITY): Payer: Self-pay | Admitting: Emergency Medicine

## 2016-01-13 ENCOUNTER — Emergency Department (HOSPITAL_COMMUNITY)
Admission: EM | Admit: 2016-01-13 | Discharge: 2016-01-13 | Disposition: A | Discharge status: Home / Self Care | Payer: Commercial Managed Care - HMO | Attending: Emergency Medicine | Admitting: Emergency Medicine

## 2016-01-13 DIAGNOSIS — R9431 Abnormal electrocardiogram [ECG] [EKG]: Secondary | ICD-10-CM | POA: Diagnosis not present

## 2016-01-13 DIAGNOSIS — R112 Nausea with vomiting, unspecified: Secondary | ICD-10-CM | POA: Diagnosis not present

## 2016-01-13 DIAGNOSIS — H8109 Meniere's disease, unspecified ear: Secondary | ICD-10-CM | POA: Diagnosis not present

## 2016-01-13 DIAGNOSIS — Z79899 Other long term (current) drug therapy: Secondary | ICD-10-CM | POA: Diagnosis not present

## 2016-01-13 DIAGNOSIS — R42 Dizziness and giddiness: Secondary | ICD-10-CM | POA: Diagnosis present

## 2016-01-13 LAB — COMPREHENSIVE METABOLIC PANEL
ALBUMIN: 4.6 g/dL (ref 3.5–5.0)
ALT: 16 U/L — AB (ref 17–63)
AST: 21 U/L (ref 15–41)
Alkaline Phosphatase: 47 U/L (ref 38–126)
Anion gap: 11 (ref 5–15)
BUN: 25 mg/dL — AB (ref 6–20)
CHLORIDE: 102 mmol/L (ref 101–111)
CO2: 26 mmol/L (ref 22–32)
CREATININE: 1.55 mg/dL — AB (ref 0.61–1.24)
Calcium: 9.5 mg/dL (ref 8.9–10.3)
GFR calc Af Amer: 53 mL/min — ABNORMAL LOW (ref >60)
GFR, EST NON AFRICAN AMERICAN: 45 mL/min — AB (ref >60)
Glucose, Bld: 132 mg/dL — ABNORMAL HIGH (ref 65–99)
POTASSIUM: 3.1 mmol/L — AB (ref 3.5–5.1)
SODIUM: 139 mmol/L (ref 135–145)
Total Bilirubin: 0.6 mg/dL (ref 0.3–1.2)
Total Protein: 7.5 g/dL (ref 6.5–8.1)

## 2016-01-13 LAB — CBC
HEMATOCRIT: 47 % (ref 39.0–52.0)
Hemoglobin: 16.2 g/dL (ref 13.0–17.0)
MCH: 30.8 pg (ref 26.0–34.0)
MCHC: 34.5 g/dL (ref 30.0–36.0)
MCV: 89.4 fL (ref 78.0–100.0)
PLATELETS: 181 10*3/uL (ref 150–400)
RBC: 5.26 MIL/uL (ref 4.22–5.81)
RDW: 14.2 % (ref 11.5–15.5)
WBC: 7.3 10*3/uL (ref 4.0–10.5)

## 2016-01-13 LAB — LIPASE, BLOOD: LIPASE: 32 U/L (ref 11–51)

## 2016-01-13 MED ORDER — POTASSIUM CHLORIDE CRYS ER 20 MEQ PO TBCR
40.0000 meq | EXTENDED_RELEASE_TABLET | Freq: Once | ORAL | Status: AC
  Administered 2016-01-13: 40 meq via ORAL
  Filled 2016-01-13: qty 2

## 2016-01-13 MED ORDER — ONDANSETRON HCL 4 MG/2ML IJ SOLN
4.0000 mg | Freq: Once | INTRAMUSCULAR | Status: AC
  Administered 2016-01-13: 4 mg via INTRAVENOUS
  Filled 2016-01-13: qty 2

## 2016-01-13 MED ORDER — ONDANSETRON 8 MG PO TBDP: 8.0000 mg | ORAL_TABLET | Freq: Three times a day (TID) | ORAL | Status: AC | PRN

## 2016-01-13 NOTE — ED Provider Notes (Signed)
CSN: [id number]     Arrival date & time 01/13/16  1346 History   First MD Initiated Contact with Patient 01/13/16 1444     Chief Complaint  Patient presents with  . Emesis  . Dizziness    HPI Pt has a history of Meneir's disease.  Pt has a severe bout many years ago when he was first diagnosed.  He has had some mild episodes over the years but today he had a more severe episode.  He has noticed increasing episodes since having thyroid surgery a couple of months ago.  Today he had a severe spell while driving.   It occurred 45 minutes prior to arrival.  He felt diaphoretic and the room was spinning.  He had extreme vertigo and nausea.  Since that occurred he has felt much better. His vertigo has resolved and the nausea is almost gone.  No trouble with speech.  No trouble with weakness, numbness.    Past Medical History  Diagnosis Date  . Frequency of urination   . Diverticula, bladder    Past Surgical History  Procedure Laterality Date  . Hernia repair  AGE 39  . Left knee surgery  [place]  . Hip surgery  1976  . Removal of shrapnel right eye  1971  . Cystoscopy  06/05/2012    Procedure: CYSTOSCOPY;  Surgeon: Ailene Rud, MD;  Location: [facility];  Service: Urology;  Laterality: N/A;  C-ARM   . Transurethral resection of prostate  07/07/2012    Procedure: TRANSURETHRAL RESECTION OF THE PROSTATE WITH GYRUS INSTRUMENTS;  Surgeon: Ailene Rud, MD;  Location: [facility];  Service: Urology;  Laterality: N/A;   History reviewed. No pertinent family history. Social History  Substance Use Topics  . Smoking status: Never Smoker   . Smokeless tobacco: Never Used  . Alcohol Use: None    Review of Systems  All other systems reviewed and are negative.     Allergies  Review of patient's allergies indicates no known allergies.  Home Medications   Prior to Admission medications   Medication Sig Start Date End Date Taking?  Authorizing Provider  hydrochlorothiazide (MICROZIDE) 12.5 MG capsule Take 12.5 mg by mouth daily.   Yes Historical Provider, MD  Multiple Vitamin (MULTI-VITAMINS) TABS Take 1 tablet by mouth daily. 10/24/13  Yes Historical Provider, MD  ondansetron (ZOFRAN ODT) 8 MG disintegrating tablet Take 1 tablet (8 mg total) by mouth every 8 (eight) hours as needed for nausea or vomiting. 01/13/16   [name], MD   BP 144/87 mmHg  Pulse 68  Temp(Src) 97.5 F (36.4 C) (Oral)  Resp 12  SpO2 94% Physical Exam  Constitutional: He is oriented to person, place, and time. He appears well-developed and well-nourished. No distress.  HENT:  Head: Normocephalic and atraumatic.  Right Ear: External ear normal.  Left Ear: External ear normal.  Mouth/Throat: Oropharynx is clear and moist.  Eyes: Conjunctivae are normal. Right eye exhibits no discharge. Left eye exhibits no discharge. No scleral icterus.  Neck: Neck supple. No tracheal deviation present.  Cardiovascular: Normal rate, regular rhythm and intact distal pulses.   Pulmonary/Chest: Effort normal and breath sounds normal. No stridor. No respiratory distress. He has no wheezes. He has no rales.  Abdominal: Soft. Bowel sounds are normal. He exhibits no distension. There is no tenderness. There is no rebound and no guarding.  Musculoskeletal: He exhibits no edema or tenderness.  Neurological: He is alert  and oriented to person, place, and time. He has normal strength. No cranial nerve deficit (No facial droop, extraocular movements intact, tongue midline ) or sensory deficit. He exhibits normal muscle tone. He displays no seizure activity. Coordination normal.  No pronator drift bilateral upper extrem, able to hold both legs off bed for 5 seconds, sensation intact in all extremities, no visual field cuts, no left or right sided neglect, normal finger-nose exam bilaterally, no nystagmus noted   Skin: Skin is warm and dry. No rash noted.  Psychiatric: He has a  normal mood and affect.  Nursing note and vitals reviewed.   ED Course  Procedures (including critical care time) Labs Review Labs Reviewed  COMPREHENSIVE METABOLIC PANEL - Abnormal; Notable for the following:    Potassium 3.1 (*)    Glucose, Bld 132 (*)    BUN 25 (*)    Creatinine, Ser 1.55 (*)    ALT 16 (*)    GFR calc non Af Amer 45 (*)    GFR calc Af Amer 53 (*)    All other components within normal limits  LIPASE, BLOOD  CBC  URINALYSIS, ROUTINE W REFLEX MICROSCOPIC (NOT AT [facility])    I have personally reviewed and evaluated these images and lab results as part of my medical decision-making.   EKG Interpretation   Date/Time:  Friday Jan 13 2016 13:49:02 EDT Ventricular Rate:  62 PR Interval:  149 QRS Duration: 102 QT Interval:  466 QTC Calculation: 473 R Axis:   70 Text Interpretation:  Sinus rhythm Low voltage, precordial leads No  previous tracing Confirmed by Annel Zunker  MD-J, Saquoia Sianez [id number]) on 01/13/2016  3:23:21 PM      MDM   Final diagnoses:  Meniere disease, unspecified laterality    Patient has a history of Mnire's disease. The episode that he had this evening felt very similar to prior episodes. Patient was given a dose of Zofran prior to my evaluation. Feeling much better at this point. He has a normal neurologic exam. He is able to ambulate around the emergency room without any difficulty.  I doubt stroke, TIA or any acute cardiac event.  The patient feels well and would like to go home. I will give her a prescription for Zofran    [name], MD 01/13/16 1542

## 2016-01-13 NOTE — ED Notes (Signed)
Pt given urinal and made aware of need for urine specimen 

## 2016-01-13 NOTE — ED Notes (Signed)
Patient states that 2.5 months ago he had thyroid surgery and after that he has had dizziness and issues for crystals in his ears. But denies any vestibular rehab.  Patient states that he also pulled a tick off himself about 8 days ago.

## 2016-01-13 NOTE — Discharge Instructions (Signed)
Dizziness Dizziness is a common problem. It is a feeling of unsteadiness or light-headedness. You may feel like you are about to faint. Dizziness can lead to injury if you stumble or fall. Anyone can become dizzy, but dizziness is more common in older adults. This condition can be caused by a number of things, including medicines, dehydration, or illness. HOME CARE INSTRUCTIONS Taking these steps may help with your condition: Eating and Drinking  Drink enough fluid to keep your urine clear or pale yellow. This helps to keep you from becoming dehydrated. Try to drink more clear fluids, such as water.  Do not drink alcohol.  Limit your caffeine intake if directed by your health care provider.  Limit your salt intake if directed by your health care provider. Activity  Avoid making quick movements.  Rise slowly from chairs and steady yourself until you feel okay.  In the morning, first sit up on the side of the bed. When you feel okay, stand slowly while you hold onto something until you know that your balance is fine.  Move your legs often if you need to stand in one place for a long time. Tighten and relax your muscles in your legs while you are standing.  Do not drive or operate heavy machinery if you feel dizzy.  Avoid bending down if you feel dizzy. Place items in your home so that they are easy for you to reach without leaning over. Lifestyle  Do not use any tobacco products, including cigarettes, chewing tobacco, or electronic cigarettes. If you need help quitting, ask your health care provider.  Try to reduce your stress level, such as with yoga or meditation. Talk with your health care provider if you need help. General Instructions  Watch your dizziness for any changes.  Take medicines only as directed by your health care provider. Talk with your health care provider if you think that your dizziness is caused by a medicine that you are taking.  Tell a friend or a family  member that you are feeling dizzy. If he or she notices any changes in your behavior, have this person call your health care provider.  Keep all follow-up visits as directed by your health care provider. This is important. SEEK MEDICAL CARE IF:  Your dizziness does not go away.  Your dizziness or light-headedness gets worse.  You feel nauseous.  You have reduced hearing.  You have new symptoms.  You are unsteady on your feet or you feel like the room is spinning. SEEK IMMEDIATE MEDICAL CARE IF:  You vomit or have diarrhea and are unable to eat or drink anything.  You have problems talking, walking, swallowing, or using your arms, hands, or legs.  You feel generally weak.  You are not thinking clearly or you have trouble forming sentences. It may take a friend or family member to notice this.  You have chest pain, abdominal pain, shortness of breath, or sweating.  Your vision changes.  You notice any bleeding.  You have a headache.  You have neck pain or a stiff neck.  You have a fever.   This information is not intended to replace advice given to you by your health care provider. Make sure you discuss any questions you have with your health care provider.   Document Released: 01/30/2001 Document Revised: 12/21/2014 Document Reviewed: 08/02/2014 Elsevier Interactive Patient Education 2016 [employer]  Meniere Disease Meniere disease is an inner ear disorder. It causes attacks of a spinning sensation (vertigo) and  ringing in the ear (tinnitus). It also causes hearing loss and a sensation of fullness or pressure in your ear.  Meniere disease is lifelong. It may get worse over time. Symptoms usually begin in one ear but may eventually affect both ears.  CAUSES Meniere disease is caused by having too much of the fluid that is in your inner ear (endolymph). When endolymph builds up in your inner ear, it affects the nerves that control balance and hearing. The reason for  the endolymph buildup is not known. Possible causes include:  Allergy.  An abnormal reaction of the body's defense system (autoimmune disease).  Viral infection of the inner ear.  Head injury. RISK FACTORS  Age older than 40 years.  Family history of Meniere disease.  History of autoimmune disease.  History of migraine headaches. SIGNS AND SYMPTOMS Symptoms of Meniere disease can come and go and may last for up to 4 hours at a time. Symptoms usually start in one ear and may become more frequent and eventually involve both ears. Symptoms can include:  Fullness and pressure in your ear.  Roaring or ringing in your ear.  Vertigo and loss of balance.  Decreased hearing.  Nausea and vomiting. DIAGNOSIS Your health care provider will perform a physical exam. Tests may be done to confirm a diagnosis of Meniere disease. These tests may include:  A hearing test (audiogram).  An electronystagmogram. This tests your balance nerve (vestibular nerve).  Imaging studies, such as CT or MRI, of your inner ear. TREATMENT There is no cure for Meniere disease, but it can be managed. Management may include:  A diet that may help relieve symptoms of Meniere disease.  Use of medicines to reduce:  Vertigo.  Nausea.  Fluid retention.  Use of an air pressure pulse generator. This is a machine that sends small pressure pulses into your ear canal.  Inner ear surgery (rare). When you experience symptoms, it can be helpful to lie down on a flat surface and focus your eyes on one object that does not move. Try to stay in that position until your symptoms go away.  HOME CARE INSTRUCTIONS   Take medicines only as directed by your health care provider.  Eat the same amount of food at the same time every day, including snacks.  Do not skip meals.  Limit the salt in your diet to 1,000 mg a day.  Avoid caffeine.  Limit alcoholic drinks to one drink a day.  Do not eat foods containing  monosodium glutamate (MSG).  Drink enough fluids to keep your urine clear or pale yellow.  Do not use any tobacco products including cigarettes, chewing tobacco, or electronic cigarettes. If you need help quitting, ask your health care provider.  Find ways to reduce or avoid stress. SEEK MEDICAL CARE IF:   You have symptoms that last longer than 4 hours.  You have new or more severe symptoms. SEEK IMMEDIATE MEDICAL CARE IF:   You have been vomiting for 24 hours.  You are not able to keep fluids down.  You have chest pain or trouble breathing.   This information is not intended to replace advice given to you by your health care provider. Make sure you discuss any questions you have with your health care provider.   Document Released: 08/03/2000 Document Revised: 08/27/2014 Document Reviewed: 07/20/2013 Elsevier Interactive Patient Education [employer].

## 2016-01-13 NOTE — ED Notes (Signed)
Pt reports emesis and dizziness that began while driving down the highway 45 minutes ago. Pt sweaty. Denies CP or SOB.

## 2016-01-13 NOTE — ED Notes (Signed)
Pt ambulated in hall without assistance 

## 2016-01-13 NOTE — ED Notes (Signed)
RN at bedside starting IV will collect labs 

## 2016-02-03 DIAGNOSIS — H8109 Meniere's disease, unspecified ear: Secondary | ICD-10-CM | POA: Diagnosis not present

## 2016-03-09 DIAGNOSIS — H903 Sensorineural hearing loss, bilateral: Secondary | ICD-10-CM | POA: Diagnosis not present

## 2016-03-09 DIAGNOSIS — R42 Dizziness and giddiness: Secondary | ICD-10-CM | POA: Diagnosis not present

## 2016-03-09 DIAGNOSIS — H9313 Tinnitus, bilateral: Secondary | ICD-10-CM | POA: Diagnosis not present

## 2016-12-14 DIAGNOSIS — R351 Nocturia: Secondary | ICD-10-CM | POA: Diagnosis not present

## 2016-12-14 DIAGNOSIS — N401 Enlarged prostate with lower urinary tract symptoms: Secondary | ICD-10-CM | POA: Diagnosis not present

## 2016-12-14 DIAGNOSIS — N5201 Erectile dysfunction due to arterial insufficiency: Secondary | ICD-10-CM | POA: Diagnosis not present

## 2017-01-15 DIAGNOSIS — G8929 Other chronic pain: Secondary | ICD-10-CM | POA: Insufficient documentation

## 2017-01-23 DIAGNOSIS — S83282A Other tear of lateral meniscus, current injury, left knee, initial encounter: Secondary | ICD-10-CM | POA: Diagnosis not present

## 2017-01-23 DIAGNOSIS — G8918 Other acute postprocedural pain: Secondary | ICD-10-CM | POA: Diagnosis not present

## 2017-01-23 DIAGNOSIS — S83242A Other tear of medial meniscus, current injury, left knee, initial encounter: Secondary | ICD-10-CM | POA: Diagnosis not present

## 2017-01-23 DIAGNOSIS — M94262 Chondromalacia, left knee: Secondary | ICD-10-CM | POA: Diagnosis not present

## 2017-01-29 DIAGNOSIS — Z9889 Other specified postprocedural states: Secondary | ICD-10-CM | POA: Insufficient documentation

## 2019-03-26 DIAGNOSIS — H6692 Otitis media, unspecified, left ear: Secondary | ICD-10-CM | POA: Diagnosis not present

## 2019-03-26 DIAGNOSIS — H60592 Other noninfective acute otitis externa, left ear: Secondary | ICD-10-CM | POA: Diagnosis not present

## 2019-06-18 DIAGNOSIS — Z03818 Encounter for observation for suspected exposure to other biological agents ruled out: Secondary | ICD-10-CM | POA: Diagnosis not present

## 2019-10-06 DIAGNOSIS — Z03818 Encounter for observation for suspected exposure to other biological agents ruled out: Secondary | ICD-10-CM | POA: Diagnosis not present

## 2021-10-31 ENCOUNTER — Emergency Department (HOSPITAL_BASED_OUTPATIENT_CLINIC_OR_DEPARTMENT_OTHER): Payer: No Typology Code available for payment source

## 2021-10-31 ENCOUNTER — Encounter (HOSPITAL_BASED_OUTPATIENT_CLINIC_OR_DEPARTMENT_OTHER): Payer: Self-pay | Admitting: Emergency Medicine

## 2021-10-31 ENCOUNTER — Inpatient Hospital Stay (HOSPITAL_BASED_OUTPATIENT_CLINIC_OR_DEPARTMENT_OTHER)
Admission: EM | Admit: 2021-10-31 | Discharge: 2021-11-02 | DRG: 176 | Disposition: A | Discharge status: Home / Self Care | Payer: No Typology Code available for payment source | Attending: Family Medicine | Admitting: Family Medicine

## 2021-10-31 ENCOUNTER — Other Ambulatory Visit: Payer: Self-pay

## 2021-10-31 DIAGNOSIS — N400 Enlarged prostate without lower urinary tract symptoms: Secondary | ICD-10-CM | POA: Insufficient documentation

## 2021-10-31 DIAGNOSIS — G8929 Other chronic pain: Secondary | ICD-10-CM | POA: Diagnosis not present

## 2021-10-31 DIAGNOSIS — I2694 Multiple subsegmental pulmonary emboli without acute cor pulmonale: Principal | ICD-10-CM | POA: Diagnosis present

## 2021-10-31 DIAGNOSIS — Z20822 Contact with and (suspected) exposure to covid-19: Secondary | ICD-10-CM | POA: Diagnosis not present

## 2021-10-31 DIAGNOSIS — Z66 Do not resuscitate: Secondary | ICD-10-CM | POA: Diagnosis present

## 2021-10-31 DIAGNOSIS — D72829 Elevated white blood cell count, unspecified: Secondary | ICD-10-CM | POA: Diagnosis not present

## 2021-10-31 DIAGNOSIS — R000 Tachycardia, unspecified: Secondary | ICD-10-CM | POA: Diagnosis not present

## 2021-10-31 DIAGNOSIS — E876 Hypokalemia: Secondary | ICD-10-CM | POA: Diagnosis not present

## 2021-10-31 DIAGNOSIS — N1831 Chronic kidney disease, stage 3a: Secondary | ICD-10-CM | POA: Diagnosis present

## 2021-10-31 DIAGNOSIS — Z86010 Personal history of colonic polyps: Secondary | ICD-10-CM | POA: Insufficient documentation

## 2021-10-31 DIAGNOSIS — M47816 Spondylosis without myelopathy or radiculopathy, lumbar region: Secondary | ICD-10-CM | POA: Insufficient documentation

## 2021-10-31 DIAGNOSIS — R509 Fever, unspecified: Secondary | ICD-10-CM

## 2021-10-31 DIAGNOSIS — I82432 Acute embolism and thrombosis of left popliteal vein: Secondary | ICD-10-CM | POA: Diagnosis present

## 2021-10-31 DIAGNOSIS — I82452 Acute embolism and thrombosis of left peroneal vein: Secondary | ICD-10-CM | POA: Diagnosis not present

## 2021-10-31 DIAGNOSIS — I129 Hypertensive chronic kidney disease with stage 1 through stage 4 chronic kidney disease, or unspecified chronic kidney disease: Secondary | ICD-10-CM | POA: Diagnosis not present

## 2021-10-31 DIAGNOSIS — R0789 Other chest pain: Secondary | ICD-10-CM | POA: Diagnosis not present

## 2021-10-31 DIAGNOSIS — R609 Edema, unspecified: Secondary | ICD-10-CM

## 2021-10-31 DIAGNOSIS — I4510 Unspecified right bundle-branch block: Secondary | ICD-10-CM | POA: Diagnosis present

## 2021-10-31 DIAGNOSIS — I2699 Other pulmonary embolism without acute cor pulmonale: Secondary | ICD-10-CM | POA: Diagnosis not present

## 2021-10-31 DIAGNOSIS — Z79899 Other long term (current) drug therapy: Secondary | ICD-10-CM | POA: Diagnosis not present

## 2021-10-31 DIAGNOSIS — D72828 Other elevated white blood cell count: Secondary | ICD-10-CM | POA: Diagnosis not present

## 2021-10-31 DIAGNOSIS — H8109 Meniere's disease, unspecified ear: Secondary | ICD-10-CM | POA: Insufficient documentation

## 2021-10-31 LAB — CBC
HCT: 43.2 % (ref 39.0–52.0)
Hemoglobin: 15 g/dL (ref 13.0–17.0)
MCH: 31.8 pg (ref 26.0–34.0)
MCHC: 34.7 g/dL (ref 30.0–36.0)
MCV: 91.7 fL (ref 80.0–100.0)
Platelets: 215 10*3/uL (ref 150–400)
RBC: 4.71 MIL/uL (ref 4.22–5.81)
RDW: 12.4 % (ref 11.5–15.5)
WBC: 11.3 10*3/uL — ABNORMAL HIGH (ref 4.0–10.5)
nRBC: 0 % (ref 0.0–0.2)

## 2021-10-31 LAB — COMPREHENSIVE METABOLIC PANEL
ALT: 14 U/L (ref 0–44)
AST: 23 U/L (ref 15–41)
Albumin: 3.9 g/dL (ref 3.5–5.0)
Alkaline Phosphatase: 48 U/L (ref 38–126)
Anion gap: 12 (ref 5–15)
BUN: 24 mg/dL — ABNORMAL HIGH (ref 8–23)
CO2: 25 mmol/L (ref 22–32)
Calcium: 9.1 mg/dL (ref 8.9–10.3)
Chloride: 97 mmol/L — ABNORMAL LOW (ref 98–111)
Creatinine, Ser: 1.3 mg/dL — ABNORMAL HIGH (ref 0.61–1.24)
GFR, Estimated: 59 mL/min — ABNORMAL LOW (ref >60)
Glucose, Bld: 103 mg/dL — ABNORMAL HIGH (ref 70–99)
Potassium: 3.7 mmol/L (ref 3.5–5.1)
Sodium: 134 mmol/L — ABNORMAL LOW (ref 135–145)
Total Bilirubin: 1.2 mg/dL (ref 0.3–1.2)
Total Protein: 7.7 g/dL (ref 6.5–8.1)

## 2021-10-31 LAB — RESP PANEL BY RT-PCR (FLU A&B, COVID) ARPGX2
Influenza A by PCR: NEGATIVE
Influenza B by PCR: NEGATIVE
SARS Coronavirus 2 by RT PCR: NEGATIVE

## 2021-10-31 LAB — TROPONIN I (HIGH SENSITIVITY)
Troponin I (High Sensitivity): 6 ng/L (ref <18)
Troponin I (High Sensitivity): 6 ng/L (ref <18)

## 2021-10-31 LAB — URINALYSIS, ROUTINE W REFLEX MICROSCOPIC
Bilirubin Urine: NEGATIVE
Glucose, UA: NEGATIVE mg/dL
Ketones, ur: NEGATIVE mg/dL
Leukocytes,Ua: NEGATIVE
Nitrite: NEGATIVE
Protein, ur: NEGATIVE mg/dL
Specific Gravity, Urine: 1.01 (ref 1.005–1.030)
pH: 5.5 (ref 5.0–8.0)

## 2021-10-31 LAB — URINALYSIS, MICROSCOPIC (REFLEX): WBC, UA: NONE SEEN WBC/hpf (ref 0–5)

## 2021-10-31 LAB — HEPARIN LEVEL (UNFRACTIONATED): Heparin Unfractionated: 0.28 IU/mL — ABNORMAL LOW (ref 0.30–0.70)

## 2021-10-31 LAB — MRSA NEXT GEN BY PCR, NASAL: MRSA by PCR Next Gen: NOT DETECTED

## 2021-10-31 LAB — ANTITHROMBIN III: AntiThromb III Func: 112 % (ref 75–120)

## 2021-10-31 MED ORDER — HEPARIN (PORCINE) 25000 UT/250ML-% IV SOLN
1550.0000 [IU]/h | INTRAVENOUS | Status: DC
  Administered 2021-10-31: 1400 [IU]/h via INTRAVENOUS
  Administered 2021-11-01 (×2): 1550 [IU]/h via INTRAVENOUS
  Filled 2021-10-31 (×3): qty 250

## 2021-10-31 MED ORDER — IOHEXOL 350 MG/ML SOLN
100.0000 mL | Freq: Once | INTRAVENOUS | Status: AC | PRN
  Administered 2021-10-31: 100 mL via INTRAVENOUS

## 2021-10-31 MED ORDER — MORPHINE SULFATE (PF) 4 MG/ML IV SOLN
4.0000 mg | Freq: Once | INTRAVENOUS | Status: AC
  Administered 2021-10-31: 4 mg via INTRAVENOUS
  Filled 2021-10-31: qty 1

## 2021-10-31 MED ORDER — POLYETHYLENE GLYCOL 3350 17 G PO PACK: 17.0000 g | PACK | Freq: Every day | ORAL | Status: DC | PRN

## 2021-10-31 MED ORDER — ACETAMINOPHEN 650 MG RE SUPP: 650.0000 mg | Freq: Four times a day (QID) | RECTAL | Status: DC | PRN

## 2021-10-31 MED ORDER — ACETAMINOPHEN 325 MG PO TABS
650.0000 mg | ORAL_TABLET | Freq: Four times a day (QID) | ORAL | Status: DC | PRN
Start: 2021-10-31 — End: 2021-11-02
  Administered 2021-11-01: 650 mg via ORAL
  Filled 2021-10-31: qty 2

## 2021-10-31 MED ORDER — ORAL CARE MOUTH RINSE
15.0000 mL | Freq: Two times a day (BID) | OROMUCOSAL | Status: DC
  Administered 2021-10-31 – 2021-11-02 (×4): 15 mL via OROMUCOSAL

## 2021-10-31 MED ORDER — HYDROMORPHONE HCL 1 MG/ML IJ SOLN
0.5000 mg | INTRAMUSCULAR | Status: DC | PRN
  Administered 2021-10-31: 1 mg via INTRAVENOUS
  Filled 2021-10-31: qty 1

## 2021-10-31 MED ORDER — ONDANSETRON HCL 4 MG/2ML IJ SOLN
4.0000 mg | Freq: Once | INTRAMUSCULAR | Status: AC
  Administered 2021-10-31: 4 mg via INTRAVENOUS
  Filled 2021-10-31: qty 2

## 2021-10-31 MED ORDER — SODIUM CHLORIDE 0.9% FLUSH
3.0000 mL | Freq: Two times a day (BID) | INTRAVENOUS | Status: DC
  Administered 2021-10-31 – 2021-11-02 (×4): 3 mL via INTRAVENOUS

## 2021-10-31 MED ORDER — CHLORHEXIDINE GLUCONATE CLOTH 2 % EX PADS
6.0000 | MEDICATED_PAD | Freq: Every day | CUTANEOUS | Status: DC
  Administered 2021-10-31 – 2021-11-01 (×2): 6 via TOPICAL

## 2021-10-31 MED ORDER — HEPARIN BOLUS VIA INFUSION
5000.0000 [IU] | Freq: Once | INTRAVENOUS | Status: AC
  Administered 2021-10-31: 5000 [IU] via INTRAVENOUS

## 2021-10-31 NOTE — Progress Notes (Signed)
ANTICOAGULATION CONSULT NOTE  ? ?Pharmacy Consult for heparin ?Indication: pulmonary embolus and DVT ? ?No Known Allergies ? ?Patient Measurements: ?Height: '5\' 10"'$  (177.8 cm) ?Weight: 84.8 kg (187 lb) ?IBW/kg (Calculated) : 73 ?Heparin Dosing Weight: 84.8kg ? ?Vital Signs: ?Temp: 98.9 ?F (37.2 ?C) (03/14 2327) ?Temp Source: Oral (03/14 2327) ?BP: 135/80 (03/14 2000) ?Pulse Rate: 70 (03/14 2001) ? ?Labs: ?Recent Labs  ?  10/31/21 ?1227 10/31/21 ?1502 10/31/21 ?2309  ?HGB 15.0  --   --   ?HCT 43.2  --   --   ?PLT 215  --   --   ?HEPARINUNFRC  --   --  0.28*  ?CREATININE 1.30*  --   --   ?TROPONINIHS 6 6  --   ? ? ? ?Estimated Creatinine Clearance: 53.8 mL/min (A) (by C-G formula based on SCr of 1.3 mg/dL (H)). ? ? ?Medical History: ?Past Medical History:  ?Diagnosis Date  ? Diverticula, bladder   ? Frequency of urination   ? ? ?Medications:  ?Infusions:  ? heparin 1,400 Units/hr (10/31/21 1527)  ? ? ?Assessment: ?48 yom presented to the ED with CP. Found to have a PE and DVT. To start IV heparin. Baseline CBC is WNL and he is not on anticoagulation PTA.  ? ?10/31/2021: ?Initial heparin level 0.28- slightly below goal on IV heparin at 1400 units/hr ?No bleeding or infusion related issues reported by RN ? ?Goal of Therapy:  ?Heparin level 0.3-0.7 units/ml ?Monitor platelets by anticoagulation protocol: Yes ?  ?Plan:  ?Increase Heparin gtt to 1550 units/hr ?Recheck an 8 hr heparin level ?Daily heparin level and CBC  ? ?Netta Cedars PharmD ?10/31/2021,11:39 PM ? ? ?

## 2021-10-31 NOTE — ED Provider Notes (Signed)
?[facility] ?Provider Note ? ? ?CSN: [id number] ?Arrival date & time: 10/31/21  1147 ? ?  ? ?History ? ?Chief Complaint  ?Patient presents with  ? Chest Pain  ? ? ?Brian Kemp is a 71 y.o. male. ? ?Pt is a 70 yo male with a pmhx significant for htn, ckd, and hearing loss.  Pt said he is normally very active, but has had some leg swelling for a few months (November) He woke up this am with some pain to his left chest.  He had some sob as well.  No n/v.  No cough.  Increased sob with activity. Pain with deep breath. ? ? ?  ? ?Home Medications ?Prior to Admission medications   ?Medication Sig Start Date End Date Taking? Authorizing Provider  ?hydrochlorothiazide (MICROZIDE) 12.5 MG capsule Take 12.5 mg by mouth daily.    [provider]  ?Multiple Vitamin (MULTI-VITAMINS) TABS Take 1 tablet by mouth daily. 10/24/13   [provider]  ?ondansetron (ZOFRAN ODT) 8 MG disintegrating tablet Take 1 tablet (8 mg total) by mouth every 8 (eight) hours as needed for nausea or vomiting. 01/13/16   [name], MD  ?   ? ?Allergies    ?Patient has no known allergies.   ? ?Review of Systems   ?Review of Systems  ?Respiratory:  Positive for shortness of breath.   ?Cardiovascular:  Positive for chest pain and leg swelling.  ?All other systems reviewed and are negative. ? ?Physical Exam ?Updated Vital Signs ?BP (!) 128/99   Pulse (!) 101   Temp 99.1 ?F (37.3 ?C) (Oral)   Resp (!) 31   Ht '5\' 10"'$  (1.778 m)   Wt 84.8 kg   SpO2 92%   BMI 26.83 kg/m?  ?Physical Exam ?Vitals and nursing note reviewed.  ?Constitutional:   ?   Appearance: He is well-developed.  ?HENT:  ?   Head: Normocephalic and atraumatic.  ?Eyes:  ?   Extraocular Movements: Extraocular movements intact.  ?   Pupils: Pupils are equal, round, and reactive to light.  ?Cardiovascular:  ?   Rate and Rhythm: Normal rate. Rhythm irregular.  ?   Heart sounds: Normal heart sounds.  ?   Comments: PACs on monitor. ?Pulmonary:  ?    Effort: Pulmonary effort is normal.  ?   Breath sounds: Normal breath sounds.  ?Abdominal:  ?   General: Bowel sounds are normal.  ?   Palpations: Abdomen is soft.  ?Musculoskeletal:     ?   General: Normal range of motion.  ?   Cervical back: Normal range of motion and neck supple.  ?   Left lower leg: Edema present.  ?   Comments: Left leg with pain and swelling  ?Skin: ?   General: Skin is warm.  ?   Capillary Refill: Capillary refill takes less than 2 seconds.  ?Neurological:  ?   General: No focal deficit present.  ?   Mental Status: He is alert and oriented to person, place, and time.  ?Psychiatric:     ?   Mood and Affect: Mood normal.     ?   Behavior: Behavior normal.  ? ? ?ED Results / Procedures / Treatments   ?Labs ?(all labs ordered are listed, but only abnormal results are displayed) ?Labs Reviewed  ?CBC - Abnormal; Notable for the following components:  ?    Result Value  ? WBC 11.3 (*)   ? All other components within normal  limits  ?COMPREHENSIVE METABOLIC PANEL - Abnormal; Notable for the following components:  ? Sodium 134 (*)   ? Chloride 97 (*)   ? Glucose, Bld 103 (*)   ? BUN 24 (*)   ? Creatinine, Ser 1.30 (*)   ? GFR, Estimated 59 (*)   ? All other components within normal limits  ?RESP PANEL BY RT-PCR (FLU A&B, COVID) ARPGX2  ?URINALYSIS, ROUTINE W REFLEX MICROSCOPIC  ?ANTITHROMBIN III  ?PROTEIN C ACTIVITY  ?PROTEIN C, TOTAL  ?PROTEIN S ACTIVITY  ?PROTEIN S, TOTAL  ?LUPUS ANTICOAGULANT PANEL  ?BETA-2-GLYCOPROTEIN I ABS, IGG/M/A  ?HOMOCYSTEINE  ?FACTOR 5 LEIDEN  ?PROTHROMBIN GENE MUTATION  ?CARDIOLIPIN ANTIBODIES, IGG, IGM, IGA  ?HEPARIN LEVEL (UNFRACTIONATED)  ?TROPONIN I (HIGH SENSITIVITY)  ?TROPONIN I (HIGH SENSITIVITY)  ? ? ?EKG ?EKG Interpretation ? ?Date/Time:  Tuesday October 31 2021 15:24:07 EDT ?Ventricular Rate:  108 ?PR Interval:  118 ?QRS Duration: 134 ?QT Interval:  388 ?QTC Calculation: 513 ?R Axis:   65 ?Text Interpretation: Sinus tachycardia with irregular rate Right bundle  branch block Since last tracing rate faster Confirmed by Isla Pence [phone]) on 10/31/2021 3:28:12 PM ? ?Radiology ?CT Angio Chest PE W and/or Wo Contrast ? ?Result Date: 10/31/2021 ?CLINICAL DATA:  PE suspected EXAM: CT ANGIOGRAPHY CHEST WITH CONTRAST TECHNIQUE: Multidetector CT imaging of the chest was performed using the standard protocol during bolus administration of intravenous contrast. Multiplanar CT image reconstructions and MIPs were obtained to evaluate the vascular anatomy. RADIATION DOSE REDUCTION: This exam was performed according to the departmental dose-optimization program which includes automated exposure control, adjustment of the mA and/or kV according to patient size and/or use of iterative reconstruction technique. CONTRAST:  151m OMNIPAQUE IOHEXOL 350 MG/ML SOLN COMPARISON:  None. FINDINGS: Cardiovascular: Satisfactory opacification of the pulmonary arteries to the segmental level. Positive examination for pulmonary embolism with partially occlusive lobar to segmental embolus present in all lobes. Normal heart size. RV LV ratio is preserved at 0.8. No pericardial effusion. Mediastinum/Nodes: No enlarged mediastinal, hilar, or axillary lymph nodes. Thyroid gland, trachea, and esophagus demonstrate no significant findings. Lungs/Pleura: Rounded subpleural ground-glass opacity of the superior segment right lower lobe (series 5, image 51 and of the posterior lingula (series 5, image 66). No pleural effusion or pneumothorax. Upper Abdomen: No acute abnormality. Musculoskeletal: No chest wall abnormality. No acute osseous findings. Review of the MIP images confirms the above findings. IMPRESSION: 1. Positive examination for pulmonary embolism with partially occlusive lobar to segmental embolus present in all lobes. 2. Rounded subpleural ground-glass opacity of the superior segment right lower lobe and of the posterior lingula, consistent with pulmonary infarction. 3. No CT evidence of right heart  strain. These results were called by telephone at the time of interpretation on 10/31/2021 at 1:37 pm to Dr. JIsla Pence, who verbally acknowledged these results. Electronically Signed   By: ADelanna AhmadiM.D.   On: 10/31/2021 13:37  ? ?UKoreaVenous Img Lower Bilateral (DVT) ? ?Result Date: 10/31/2021 ?CLINICAL DATA:  Pain left leg, PE EXAM: BILATERAL LOWER EXTREMITY VENOUS DOPPLER ULTRASOUND TECHNIQUE: Gray-scale sonography with graded compression, as well as color Doppler and duplex ultrasound were performed to evaluate the lower extremity deep venous systems from the level of the common femoral vein and including the common femoral, femoral, profunda femoral, popliteal and calf veins including the posterior tibial, peroneal and gastrocnemius veins when visible. The superficial great saphenous vein was also interrogated. Spectral Doppler was utilized to evaluate flow at rest and with distal augmentation maneuvers  in the common femoral, femoral and popliteal veins. COMPARISON:  None. FINDINGS: RIGHT LOWER EXTREMITY Common Femoral Vein: No evidence of thrombus. Normal compressibility, respiratory phasicity and response to augmentation. Saphenofemoral Junction: No evidence of thrombus. Normal compressibility and flow on color Doppler imaging. Profunda Femoral Vein: No evidence of thrombus. Normal compressibility and flow on color Doppler imaging. Femoral Vein: No evidence of thrombus. Normal compressibility, respiratory phasicity and response to augmentation. Popliteal Vein: No evidence of thrombus. Normal compressibility, respiratory phasicity and response to augmentation. Calf Veins: There is no evidence of acute DVT. Superficial Great Saphenous Vein: No evidence of thrombus. Normal compressibility. Venous Reflux:  None. Other Findings:  None. LEFT LOWER EXTREMITY Common Femoral Vein: No evidence of thrombus. Normal compressibility, respiratory phasicity and response to augmentation. Saphenofemoral Junction: No  evidence of thrombus. Normal compressibility and flow on color Doppler imaging. Profunda Femoral Vein: No evidence of thrombus. Normal compressibility and flow on color Doppler imaging. Femoral Vein: No evidence

## 2021-10-31 NOTE — H&P (Addendum)
?History and Physical  ? ?Brian Kemp MPN:[id number] DOB: June 06, 1951 DOA: 10/31/2021 ? ?PCP: [facility]  ? ?Patient coming from: Home ? ?Chief Complaint: Chest pain ? ?HPI: Brian Kemp is a 71 y.o. male with medical history significant of BPH, CKD 3A, chronic pain, hearing loss presenting with chest pain. ? ?Patient reports that he woke up with left-sided chest pain this morning.  Also with some shortness of breath and significant dyspnea on exertion.  He states he is usually active but has had some swelling in his legs for the past few months as well.  He reports pleuritic chest pain. ? ?He denies fevers, chills, abdominal pain, constipation, diarrhea, nausea, vomiting. ? ?ED Course: Vital signs in the ED significant for heart rate in the 80s to 100s, blood pressure in the 00Q to 676P systolic, respiratory rate in the 20s, requiring 2 L to maintain saturations.  Did spike a fever of 100.8.  Lab work-up showed CMP with sodium 134, chloride 97, BUN 24, creatinine stable at 1.3, glucose 102.  CBC showed mild leukocytosis to 11.3.  Troponin negative x2.  MRSA panel screening pending.  Rester panel for flu and COVID-negative.  Urinalysis with trace hemoglobin only.  Chest x-ray showing left atelectasis only.  CTA study showing PE with partial occlusion of the lobar to segmental level in all lobes, changes consistent with pulmonary infarction, no evidence of right heart strain.  DVT study showed acute right lower extremity DVT.  Hypercoagulability work-up was ordered in the ED including Antithrombin III, protein C, protein S, lupus anticoagulant, beta-2 glycoprotein, factor V Leiden, prothrombin, cardiolipin, homocystine.  Patient started on heparin, received morphine and Zofran in the ED. ? ?Review of Systems: As per HPI otherwise all other systems reviewed and are negative. ? ?Past Medical History:  ?Diagnosis Date  ? Diverticula, bladder   ? Frequency of urination   ? ? ?Past Surgical History:   ?Procedure Laterality Date  ? CYSTOSCOPY  06/05/2012  ? Procedure: CYSTOSCOPY;  Surgeon: Ailene Rud, MD;  Location: [facility];  Service: Urology;  Laterality: N/A;  C-ARM ?  ? HERNIA REPAIR  AGE 60  ? [place]  ? [place]  ? REMOVAL OF SHRAPNEL RIGHT EYE  1971  ? TRANSURETHRAL RESECTION OF PROSTATE  07/07/2012  ? Procedure: TRANSURETHRAL RESECTION OF THE PROSTATE WITH GYRUS INSTRUMENTS;  Surgeon: Ailene Rud, MD;  Location: [facility];  Service: Urology;  Laterality: N/A;  ? ? ?Social History ? reports that he has never smoked. He has never used smokeless tobacco. He reports current alcohol use. He reports that he does not use drugs. ? ?No Known Allergies ? ?History reviewed. No pertinent family history. ? ?Prior to Admission medications   ?Medication Sig Start Date End Date Taking? Authorizing Provider  ?hydrochlorothiazide (MICROZIDE) 12.5 MG capsule Take 12.5 mg by mouth daily.    [provider]  ?Multiple Vitamin (MULTI-VITAMINS) TABS Take 1 tablet by mouth daily. 10/24/13   [provider]  ?ondansetron (ZOFRAN ODT) 8 MG disintegrating tablet Take 1 tablet (8 mg total) by mouth every 8 (eight) hours as needed for nausea or vomiting. 01/13/16   [name], MD  ? ? ?Physical Exam: ?Vitals:  ? 10/31/21 1700 10/31/21 1948 10/31/21 2000 10/31/21 2001  ?BP: 119/89 (!) 141/81 135/80   ?Pulse: 69 (!) 56 64 70  ?Resp: (!) 22 20 (!) 23 20  ?Temp:  (  S) (!) 100.8 ?F (38.2 ?C)    ?TempSrc:  (S) Oral    ?SpO2: 93% 97% 98% 99%  ?Weight:      ?Height:      ? ? ?Physical Exam ?Constitutional:   ?   General: He is not in acute distress. ?   Appearance: Normal appearance.  ?HENT:  ?   Head: Normocephalic and atraumatic.  ?   Mouth/Throat:  ?   Mouth: Mucous membranes are moist.  ?   Pharynx: Oropharynx is clear.  ?Eyes:  ?   Extraocular Movements: Extraocular movements intact.  ?   Pupils: Pupils are equal, round, and reactive to  light.  ?Cardiovascular:  ?   Rate and Rhythm: Normal rate and regular rhythm.  ?   Pulses: Normal pulses.  ?   Heart sounds: Normal heart sounds.  ?Pulmonary:  ?   Effort: Pulmonary effort is normal. No respiratory distress.  ?   Breath sounds: Normal breath sounds.  ?Abdominal:  ?   General: Bowel sounds are normal. There is no distension.  ?   Palpations: Abdomen is soft.  ?   Tenderness: There is no abdominal tenderness.  ?Musculoskeletal:     ?   General: No swelling or deformity.  ?   Left lower leg: Edema present.  ?Skin: ?   General: Skin is warm and dry.  ?Neurological:  ?   General: No focal deficit present.  ?   Mental Status: Mental status is at baseline.  ? ?Labs on Admission: I have personally reviewed following labs and imaging studies ? ?CBC: ?Recent Labs  ?Lab 10/31/21 ?1227  ?WBC 11.3*  ?HGB 15.0  ?HCT 43.2  ?MCV 91.7  ?PLT 215  ? ? ?Basic Metabolic Panel: ?Recent Labs  ?Lab 10/31/21 ?1227  ?NA 134*  ?K 3.7  ?CL 97*  ?CO2 25  ?GLUCOSE 103*  ?BUN 24*  ?CREATININE 1.30*  ?CALCIUM 9.1  ? ? ?GFR: ?Estimated Creatinine Clearance: 53.8 mL/min (A) (by C-G formula based on SCr of 1.3 mg/dL (H)). ? ?Liver Function Tests: ?Recent Labs  ?Lab 10/31/21 ?1227  ?AST 23  ?ALT 14  ?ALKPHOS 48  ?BILITOT 1.2  ?PROT 7.7  ?ALBUMIN 3.9  ? ? ?Urine analysis: ?   ?Component Value Date/Time  ? COLORURINE YELLOW 10/31/2021 1345  ? APPEARANCEUR CLEAR 10/31/2021 1345  ? LABSPEC 1.010 10/31/2021 1345  ? PHURINE 5.5 10/31/2021 1345  ? GLUCOSEU NEGATIVE 10/31/2021 1345  ? HGBUR TRACE (A) 10/31/2021 1345  ? BILIRUBINUR NEGATIVE 10/31/2021 1345  ? KETONESUR NEGATIVE 10/31/2021 1345  ? PROTEINUR NEGATIVE 10/31/2021 1345  ? NITRITE NEGATIVE 10/31/2021 1345  ? LEUKOCYTESUR NEGATIVE 10/31/2021 1345  ? ? ?Radiological Exams on Admission: ?CT Angio Chest PE W and/or Wo Contrast ? ?Result Date: 10/31/2021 ?CLINICAL DATA:  PE suspected EXAM: CT ANGIOGRAPHY CHEST WITH CONTRAST TECHNIQUE: Multidetector CT imaging of the chest was performed  using the standard protocol during bolus administration of intravenous contrast. Multiplanar CT image reconstructions and MIPs were obtained to evaluate the vascular anatomy. RADIATION DOSE REDUCTION: This exam was performed according to the departmental dose-optimization program which includes automated exposure control, adjustment of the mA and/or kV according to patient size and/or use of iterative reconstruction technique. CONTRAST:  116m OMNIPAQUE IOHEXOL 350 MG/ML SOLN COMPARISON:  None. FINDINGS: Cardiovascular: Satisfactory opacification of the pulmonary arteries to the segmental level. Positive examination for pulmonary embolism with partially occlusive lobar to segmental embolus present in all lobes. Normal heart size. RV LV ratio is preserved at  0.8. No pericardial effusion. Mediastinum/Nodes: No enlarged mediastinal, hilar, or axillary lymph nodes. Thyroid gland, trachea, and esophagus demonstrate no significant findings. Lungs/Pleura: Rounded subpleural ground-glass opacity of the superior segment right lower lobe (series 5, image 51 and of the posterior lingula (series 5, image 66). No pleural effusion or pneumothorax. Upper Abdomen: No acute abnormality. Musculoskeletal: No chest wall abnormality. No acute osseous findings. Review of the MIP images confirms the above findings. IMPRESSION: 1. Positive examination for pulmonary embolism with partially occlusive lobar to segmental embolus present in all lobes. 2. Rounded subpleural ground-glass opacity of the superior segment right lower lobe and of the posterior lingula, consistent with pulmonary infarction. 3. No CT evidence of right heart strain. These results were called by telephone at the time of interpretation on 10/31/2021 at 1:37 pm to Dr. Isla Pence , who verbally acknowledged these results. Electronically Signed   By: Delanna Ahmadi M.D.   On: 10/31/2021 13:37  ? ?US Venous Img Lower Bilateral (DVT) ? ?Result Date: 10/31/2021 ?CLINICAL DATA:   Pain left leg, PE EXAM: BILATERAL LOWER EXTREMITY VENOUS DOPPLER ULTRASOUND TECHNIQUE: Gray-scale sonography with graded compression, as well as color Doppler and duplex ultrasound were performed to eva

## 2021-10-31 NOTE — Progress Notes (Signed)
ANTICOAGULATION CONSULT NOTE - Initial Consult ? ?Pharmacy Consult for heparin ?Indication: pulmonary embolus and DVT ? ?No Known Allergies ? ?Patient Measurements: ?Height: '5\' 10"'$  (177.8 cm) ?Weight: 84.8 kg (187 lb) ?IBW/kg (Calculated) : 73 ?Heparin Dosing Weight: 84.8kg ? ?Vital Signs: ?Temp: 99.1 ?F (37.3 ?C) (03/14 1202) ?Temp Source: Oral (03/14 1202) ?BP: 125/104 (03/14 1425) ?Pulse Rate: 82 (03/14 1425) ? ?Labs: ?Recent Labs  ?  10/31/21 ?1227  ?HGB 15.0  ?HCT 43.2  ?PLT 215  ?CREATININE 1.30*  ?TROPONINIHS 6  ? ? ?Estimated Creatinine Clearance: 53.8 mL/min (A) (by C-G formula based on SCr of 1.3 mg/dL (H)). ? ? ?Medical History: ?Past Medical History:  ?Diagnosis Date  ? Diverticula, bladder   ? Frequency of urination   ? ? ?Medications:  ?Infusions:  ? heparin    ? ? ?Assessment: ?65 yom presented to the ED with CP. Found to have a PE and DVT. To start IV heparin. Baseline CBC is WNL and he is not on anticoagulation PTA.  ? ?Goal of Therapy:  ?Heparin level 0.3-0.7 units/ml ?Monitor platelets by anticoagulation protocol: Yes ?  ?Plan:  ?Heparin bolus 5000 units IV x 1 ?Heparin gtt 1400 units/hr ?Check an 8 hr heparin level ?Daily heparin level and CBC ? ?Sandar Krinke, Rande Lawman ?10/31/2021,3:03 PM ? ? ?

## 2021-10-31 NOTE — ED Notes (Signed)
Called to room by pt, noted have facial grimacing, moaning, restless on stretcher, holding left side of ant chest, states his pain is "very bad". ED MD aware. Orders rec'd and immediately implemented ?

## 2021-10-31 NOTE — ED Triage Notes (Signed)
Pt arrives pov, steady gait to triage with c/o radiating left side CP and left shoulder pain and shob starting this am. Pt denies nv, also reports right upper back pain yesterday. Pt denies cough.Pt reports freq walking, increased shob when walking last few weeks ?

## 2021-11-01 ENCOUNTER — Observation Stay (HOSPITAL_COMMUNITY): Payer: No Typology Code available for payment source

## 2021-11-01 DIAGNOSIS — R0789 Other chest pain: Secondary | ICD-10-CM | POA: Diagnosis present

## 2021-11-01 DIAGNOSIS — D72828 Other elevated white blood cell count: Secondary | ICD-10-CM | POA: Diagnosis present

## 2021-11-01 DIAGNOSIS — N1831 Chronic kidney disease, stage 3a: Secondary | ICD-10-CM | POA: Diagnosis not present

## 2021-11-01 DIAGNOSIS — I4510 Unspecified right bundle-branch block: Secondary | ICD-10-CM | POA: Diagnosis present

## 2021-11-01 DIAGNOSIS — E876 Hypokalemia: Secondary | ICD-10-CM

## 2021-11-01 DIAGNOSIS — I82452 Acute embolism and thrombosis of left peroneal vein: Secondary | ICD-10-CM

## 2021-11-01 DIAGNOSIS — I2694 Multiple subsegmental pulmonary emboli without acute cor pulmonale: Secondary | ICD-10-CM

## 2021-11-01 DIAGNOSIS — I2699 Other pulmonary embolism without acute cor pulmonale: Secondary | ICD-10-CM | POA: Diagnosis present

## 2021-11-01 DIAGNOSIS — Z20822 Contact with and (suspected) exposure to covid-19: Secondary | ICD-10-CM | POA: Diagnosis present

## 2021-11-01 DIAGNOSIS — Z66 Do not resuscitate: Secondary | ICD-10-CM | POA: Diagnosis present

## 2021-11-01 DIAGNOSIS — G8929 Other chronic pain: Secondary | ICD-10-CM | POA: Diagnosis present

## 2021-11-01 DIAGNOSIS — I82432 Acute embolism and thrombosis of left popliteal vein: Secondary | ICD-10-CM | POA: Diagnosis present

## 2021-11-01 DIAGNOSIS — I129 Hypertensive chronic kidney disease with stage 1 through stage 4 chronic kidney disease, or unspecified chronic kidney disease: Secondary | ICD-10-CM | POA: Diagnosis present

## 2021-11-01 DIAGNOSIS — Z79899 Other long term (current) drug therapy: Secondary | ICD-10-CM | POA: Diagnosis not present

## 2021-11-01 DIAGNOSIS — N400 Enlarged prostate without lower urinary tract symptoms: Secondary | ICD-10-CM | POA: Diagnosis present

## 2021-11-01 LAB — ECHOCARDIOGRAM COMPLETE
AV Mean grad: 4 mmHg
AV Peak grad: 7.2 mmHg
Ao pk vel: 1.34 m/s
Area-P 1/2: 3.48 cm2
Calc EF: 56.4 %
Height: 70 in
S' Lateral: 3.6 cm
Single Plane A2C EF: 60.7 %
Single Plane A4C EF: 51.9 %
Weight: 3026.47 oz

## 2021-11-01 LAB — PROTEIN S, TOTAL: Protein S Ag, Total: 119 % (ref 60–150)

## 2021-11-01 LAB — CBC
HCT: 41.5 % (ref 39.0–52.0)
Hemoglobin: 13.8 g/dL (ref 13.0–17.0)
MCH: 31.7 pg (ref 26.0–34.0)
MCHC: 33.3 g/dL (ref 30.0–36.0)
MCV: 95.4 fL (ref 80.0–100.0)
Platelets: 216 10*3/uL (ref 150–400)
RBC: 4.35 MIL/uL (ref 4.22–5.81)
RDW: 12.4 % (ref 11.5–15.5)
WBC: 10.6 10*3/uL — ABNORMAL HIGH (ref 4.0–10.5)
nRBC: 0 % (ref 0.0–0.2)

## 2021-11-01 LAB — COMPREHENSIVE METABOLIC PANEL
ALT: 12 U/L (ref 0–44)
AST: 13 U/L — ABNORMAL LOW (ref 15–41)
Albumin: 3.4 g/dL — ABNORMAL LOW (ref 3.5–5.0)
Alkaline Phosphatase: 46 U/L (ref 38–126)
Anion gap: 10 (ref 5–15)
BUN: 21 mg/dL (ref 8–23)
CO2: 26 mmol/L (ref 22–32)
Calcium: 8.6 mg/dL — ABNORMAL LOW (ref 8.9–10.3)
Chloride: 97 mmol/L — ABNORMAL LOW (ref 98–111)
Creatinine, Ser: 1.24 mg/dL (ref 0.61–1.24)
GFR, Estimated: >60 mL/min (ref >60)
Glucose, Bld: 122 mg/dL — ABNORMAL HIGH (ref 70–99)
Potassium: 3.1 mmol/L — ABNORMAL LOW (ref 3.5–5.1)
Sodium: 133 mmol/L — ABNORMAL LOW (ref 135–145)
Total Bilirubin: 0.6 mg/dL (ref 0.3–1.2)
Total Protein: 6.8 g/dL (ref 6.5–8.1)

## 2021-11-01 LAB — PROTIME-INR
INR: 1.1 (ref 0.8–1.2)
Prothrombin Time: 13.8 seconds (ref 11.4–15.2)

## 2021-11-01 LAB — LUPUS ANTICOAGULANT PANEL
DRVVT: 51.2 s — ABNORMAL HIGH (ref 0.0–47.0)
PTT Lupus Anticoagulant: 37.4 s (ref 0.0–43.5)

## 2021-11-01 LAB — DRVVT MIX: dRVVT Mix: 42.9 s — ABNORMAL HIGH (ref 0.0–40.4)

## 2021-11-01 LAB — HEPARIN LEVEL (UNFRACTIONATED)
Heparin Unfractionated: 0.5 IU/mL (ref 0.30–0.70)
Heparin Unfractionated: 0.58 IU/mL (ref 0.30–0.70)

## 2021-11-01 LAB — PROTEIN S ACTIVITY: Protein S Activity: 67 % (ref 63–140)

## 2021-11-01 LAB — DRVVT CONFIRM: dRVVT Confirm: 1.3 ratio — ABNORMAL HIGH (ref 0.8–1.2)

## 2021-11-01 LAB — PROTEIN C ACTIVITY: Protein C Activity: 99 % (ref 73–180)

## 2021-11-01 MED ORDER — TRIMETHOBENZAMIDE HCL 100 MG/ML IM SOLN
200.0000 mg | Freq: Once | INTRAMUSCULAR | Status: DC
  Filled 2021-11-01: qty 2

## 2021-11-01 MED ORDER — POTASSIUM CHLORIDE CRYS ER 20 MEQ PO TBCR
40.0000 meq | EXTENDED_RELEASE_TABLET | ORAL | Status: AC
  Administered 2021-11-01 (×2): 40 meq via ORAL
  Filled 2021-11-01 (×2): qty 2

## 2021-11-01 MED ORDER — HYDRALAZINE HCL 25 MG PO TABS: 25.0000 mg | ORAL_TABLET | Freq: Four times a day (QID) | ORAL | Status: DC | PRN

## 2021-11-01 NOTE — Assessment & Plan Note (Signed)
-   Creatinine 1.24, at baseline ?

## 2021-11-01 NOTE — Progress Notes (Signed)
[facility] ? ?PROGRESS NOTE ? ?KYAIR DITOMMASO GEX:[id number] DOB: 11-Oct-1950 DOA: 10/31/2021 ?PCP: [facility] ? ?Brief hospital course ? ? 71 y.o. male with medical history significant of BPH, CKD 3A, chronic pain, hearing loss presenting with chest pain.  Patient developed left-sided chest pain.  Also had shortness of breath with dyspnea on exertion.  In the ED venous duplex of lower extremity showed acute right lower extremity DVT.  CT chest showed PE with partial occlusion of the lobar to segmental level in all lobes consistent with pulmonary infarction, no evidence of right heart strain.  Patient was started on IV heparin.  ? ? ? ?Subjective  ? ?Patient seen and examined, still complains of left-sided chest pain.  Also complains of pain in the left shoulder.  Denies shortness of breath. ? ?  ? ?Assessment and Plan: ? ?* Pulmonary embolism ([place]) ?Acute PE/DVT ?-Patient presented with chest pain and shortness of breath ?-Found to have PE with partially occlusive lobar to segmental embolus present in all lobes; rounded subpleural groundglass opacity of superior segment right lower lobe and posterior lingula consistent with pulmonary infarction. ?-No evidence of right heart strain ?-Patient started on heparin per pharmacy ?-Hypercoagulability work-up ordered in the ED ?-Echocardiogram shows normal right ventricular pressure, mild elevation in pulmonary artery pressure ?-Due to clot burden, will continue with heparin for at least 24 hours and then transition to DOAC ?-Wean oxygen as tolerated ? ?Hypokalemia ?- Potassium is 3.1 ?-Replace potassium and follow BMP in am ? ?Chronic kidney disease, stage 3a ([place]) ?- Creatinine 1.24, at baseline ? ? ? ?Medications ? ?  ? Chlorhexidine Gluconate Cloth  6 each Topical Daily  ? mouth rinse  15 mL Mouth Rinse BID  ? sodium chloride flush  3 mL Intravenous Q12H  ? trimethobenzamide  200 mg Intramuscular Once  ? ? ? Data Reviewed:  ? ?CBG: ? ?No results for  input(s): GLUCAP in the last 168 hours. ? ?SpO2: 95 % ?O2 Flow Rate (L/min): 1 L/min  ? ? ?Vitals:  ? 11/01/21 1100 11/01/21 1200 11/01/21 1300 11/01/21 1400  ?BP: (!) 140/91 (!) 95/55 (!) 156/92 (!) 153/83  ?Pulse: 73 67 73 69  ?Resp: 17 (!) '24 18 19  '$ ?Temp:  98.8 ?F (37.1 ?C)    ?TempSrc:  Oral    ?SpO2: 100% 97% 96% 95%  ?Weight:      ?Height:      ? ? ? ? ?Data Reviewed: ? ?Basic Metabolic Panel: ?Recent Labs  ?Lab 10/31/21 ?1227 11/01/21 ?0711  ?NA 134* 133*  ?K 3.7 3.1*  ?CL 97* 97*  ?CO2 25 26  ?GLUCOSE 103* 122*  ?BUN 24* 21  ?CREATININE 1.30* 1.24  ?CALCIUM 9.1 8.6*  ? ? ?CBC: ?Recent Labs  ?Lab 10/31/21 ?1227 11/01/21 ?0711  ?WBC 11.3* 10.6*  ?HGB 15.0 13.8  ?HCT 43.2 41.5  ?MCV 91.7 95.4  ?PLT 215 216  ? ? ?LFT ?Recent Labs  ?Lab 10/31/21 ?1227 11/01/21 ?0711  ?AST 23 13*  ?ALT 14 12  ?ALKPHOS 48 46  ?BILITOT 1.2 0.6  ?PROT 7.7 6.8  ?ALBUMIN 3.9 3.4*  ? ?  ?Micro :  ? ? ? ?Antibiotics: ?Anti-infectives (From admission, onward)  ? ? None  ? ?  ? ? ?DVT prophylaxis: IV heparin ? ?Code Status: Full code ? ?Family Communication: No family at bedside ? ?CONSULTS: ? ? ? ?Objective  ? ? ?Physical Examination: ? ? ?General-appears in no acute distress ?Heart-S1-S2, regular, no murmur auscultated ?Lungs-clear  to auscultation bilaterally, no wheezing or crackles auscultated ?Abdomen-soft, nontender, no organomegaly ?Extremities-no edema in the lower extremities ?Neuro-alert, oriented x3, no focal deficit noted ? ?Status is: Inpatient: Pulmonary embolism ? ? ? ?  ? ? ? ?[name] ?  ?[facility] ?If 7PM-7AM, please contact night-coverage at [email or web address], ?Office  [phone] ? ? ?11/01/2021, 2:21 PM  LOS: 0 days  ? ? ? ? ? ? ? ? ? ? ?  ?

## 2021-11-01 NOTE — Progress Notes (Signed)
ANTICOAGULATION CONSULT NOTE  ? ?Pharmacy Consult for heparin ?Indication: pulmonary embolus and DVT ? ?No Known Allergies ? ?Patient Measurements: ?Height: '5\' 10"'$  (177.8 cm) ?Weight: 85.8 kg (189 lb 2.5 oz) ?IBW/kg (Calculated) : 73 ?Heparin Dosing Weight: 84.8kg ? ?Vital Signs: ?Temp: 97.8 ?F (36.6 ?C) (03/15 0800) ?Temp Source: Oral (03/15 0800) ?BP: 129/85 (03/15 0800) ?Pulse Rate: 65 (03/15 0800) ? ?Labs: ?Recent Labs  ?  10/31/21 ?1227 10/31/21 ?1502 10/31/21 ?2309 11/01/21 ?8341  ?HGB 15.0  --   --  13.8  ?HCT 43.2  --   --  41.5  ?PLT 215  --   --  216  ?LABPROT  --   --   --  13.8  ?INR  --   --   --  1.1  ?HEPARINUNFRC  --   --  0.28* 0.50  ?CREATININE 1.30*  --   --  1.24  ?TROPONINIHS 6 6  --   --   ? ? ? ?Estimated Creatinine Clearance: 56.4 mL/min (by C-G formula based on SCr of 1.24 mg/dL). ? ? ?Medical History: ?Past Medical History:  ?Diagnosis Date  ? Diverticula, bladder   ? Frequency of urination   ? ? ?Medications:  ?Infusions:  ? heparin 1,550 Units/hr (11/01/21 9622)  ? ? ?Assessment: ?30 yom presented to the ED with CP. Found to have a PE and DVT. To start IV heparin. Baseline CBC is WNL and he was not on anticoagulation PTA.  ? ?11/01/2021: ?07:11 heparin level therapeutic at 0.5 with heparin infusing at 1550 units/hr ?No bleeding or infusion related issues reported by RN ? ?Goal of Therapy:  ?Heparin level 0.3-0.7 units/ml ?Monitor platelets by anticoagulation protocol: Yes ?  ?Plan:  ?Continue IV heparin infusion at 1550 units/hr ?Confirmatory 8 hr heparin level ?Daily heparin level and CBC  ? ?Royetta Asal, PharmD, BCPS ?Clinical Pharmacist ?[place] ?Please utilize Amion for appropriate phone number to reach the unit pharmacist ([place]) ?11/01/2021 8:31 AM ? ? ? ?

## 2021-11-01 NOTE — Plan of Care (Signed)
  Problem: Education: Goal: Knowledge of General Education information will improve Description Including pain rating scale, medication(s)/side effects and non-pharmacologic comfort measures Outcome: Progressing   

## 2021-11-01 NOTE — Assessment & Plan Note (Signed)
-   Potassium is 3.1 ?-Replace potassium and follow BMP in am ?

## 2021-11-01 NOTE — Assessment & Plan Note (Addendum)
Acute PE/DVT ?-Patient presented with chest pain and shortness of breath ?-Found to have PE with partially occlusive lobar to segmental embolus present in all lobes; rounded subpleural groundglass opacity of superior segment right lower lobe and posterior lingula consistent with pulmonary infarction. ?-No evidence of right heart strain ?-Patient started on heparin per pharmacy ?-Hypercoagulability work-up ordered in the ED ?-Echocardiogram shows normal right ventricular pressure, mild elevation in pulmonary artery pressure ?-Due to clot burden, will continue with heparin for at least 24 hours and then transition to DOAC ?-Wean oxygen as tolerated ?

## 2021-11-01 NOTE — Progress Notes (Signed)
?  Transition of Care (TOC) Screening Note ? ? ?Patient Details  ?Name: Brian Kemp ?Date of Birth: 1950-11-18 ? ? ?Transition of Care (TOC) CM/SW Contact:    ?Latronda Spink, LCSW ?Phone Number: ?11/01/2021, 9:53 AM ? ? ? ?Transition of Care Department [facility]) has reviewed patient and no TOC needs have been identified at this time. We will continue to monitor patient advancement through interdisciplinary progression rounds. If new patient transition needs arise, please place a TOC consult. ? ? ?

## 2021-11-01 NOTE — Progress Notes (Signed)
Pharmacy Brief Note - Anticoagulation Evening Follow Up: ? ?Pt is a 79 yoM on heparin drip for acute PE and DVT. For full history, see note by Suzzanne Cloud, PharmD from earlier today.  ? ?Assessment: ?Confirmatory HL = 0.58 remains therapeutic on heparin infusion of 1550 units/hr ?Confirmed with RN that heparin infusing at correct rate. No interruptions/issues. No signs of bleeding.  ? ?Goal: HL 0.3 - 0.7 ? ?Plan: ?Continue heparin at current rate of 1550 units/hr ?CBC, HL with AM labs tomorrow ?Monitor for signs of bleeding ? ?Lenis Noon, PharmD ?11/01/21 ?6:37 PM ?

## 2021-11-01 NOTE — Hospital Course (Signed)
71 y.o. male with medical history significant of BPH, CKD 3A, chronic pain, hearing loss presenting with chest pain.  Patient developed left-sided chest pain.  Also had shortness of breath with dyspnea on exertion.  In the ED venous duplex of lower extremity showed acute right lower extremity DVT.  CT chest showed PE with partial occlusion of the lobar to segmental level in all lobes consistent with pulmonary infarction, no evidence of right heart strain.  Patient was started on IV heparin. ?

## 2021-11-02 ENCOUNTER — Other Ambulatory Visit (HOSPITAL_COMMUNITY): Payer: Self-pay

## 2021-11-02 DIAGNOSIS — N1831 Chronic kidney disease, stage 3a: Secondary | ICD-10-CM | POA: Diagnosis not present

## 2021-11-02 DIAGNOSIS — I2699 Other pulmonary embolism without acute cor pulmonale: Secondary | ICD-10-CM | POA: Diagnosis not present

## 2021-11-02 DIAGNOSIS — I82452 Acute embolism and thrombosis of left peroneal vein: Secondary | ICD-10-CM | POA: Diagnosis not present

## 2021-11-02 LAB — CBC
HCT: 40 % (ref 39.0–52.0)
Hemoglobin: 13.6 g/dL (ref 13.0–17.0)
MCH: 31.6 pg (ref 26.0–34.0)
MCHC: 34 g/dL (ref 30.0–36.0)
MCV: 93 fL (ref 80.0–100.0)
Platelets: 228 10*3/uL (ref 150–400)
RBC: 4.3 MIL/uL (ref 4.22–5.81)
RDW: 12.3 % (ref 11.5–15.5)
WBC: 8.6 10*3/uL (ref 4.0–10.5)
nRBC: 0 % (ref 0.0–0.2)

## 2021-11-02 LAB — BETA-2-GLYCOPROTEIN I ABS, IGG/M/A
Beta-2 Glyco I IgG: <9 GPI IgG units (ref 0–20)
Beta-2-Glycoprotein I IgA: <9 GPI IgA units (ref 0–25)
Beta-2-Glycoprotein I IgM: <9 GPI IgM units (ref 0–32)

## 2021-11-02 LAB — BASIC METABOLIC PANEL
Anion gap: 10 (ref 5–15)
BUN: 24 mg/dL — ABNORMAL HIGH (ref 8–23)
CO2: 26 mmol/L (ref 22–32)
Calcium: 8.5 mg/dL — ABNORMAL LOW (ref 8.9–10.3)
Chloride: 97 mmol/L — ABNORMAL LOW (ref 98–111)
Creatinine, Ser: 1.51 mg/dL — ABNORMAL HIGH (ref 0.61–1.24)
GFR, Estimated: 49 mL/min — ABNORMAL LOW (ref >60)
Glucose, Bld: 109 mg/dL — ABNORMAL HIGH (ref 70–99)
Potassium: 3.2 mmol/L — ABNORMAL LOW (ref 3.5–5.1)
Sodium: 133 mmol/L — ABNORMAL LOW (ref 135–145)

## 2021-11-02 LAB — CARDIOLIPIN ANTIBODIES, IGG, IGM, IGA
Anticardiolipin IgA: <9 APL U/mL (ref 0–11)
Anticardiolipin IgG: <9 GPL U/mL (ref 0–14)
Anticardiolipin IgM: <9 MPL U/mL (ref 0–12)

## 2021-11-02 LAB — HEPARIN LEVEL (UNFRACTIONATED): Heparin Unfractionated: 0.33 IU/mL (ref 0.30–0.70)

## 2021-11-02 LAB — MAGNESIUM: Magnesium: 2.3 mg/dL (ref 1.7–2.4)

## 2021-11-02 MED ORDER — APIXABAN 5 MG PO TABS
10.0000 mg | ORAL_TABLET | Freq: Two times a day (BID) | ORAL | Status: DC
  Administered 2021-11-02: 10 mg via ORAL
  Filled 2021-11-02 (×2): qty 2

## 2021-11-02 MED ORDER — POTASSIUM CHLORIDE CRYS ER 20 MEQ PO TBCR
40.0000 meq | EXTENDED_RELEASE_TABLET | ORAL | Status: AC
  Administered 2021-11-02 (×2): 40 meq via ORAL
  Filled 2021-11-02 (×2): qty 2

## 2021-11-02 MED ORDER — APIXABAN 5 MG PO TABS: 5.0000 mg | ORAL_TABLET | Freq: Two times a day (BID) | ORAL | 3 refills | Status: AC

## 2021-11-02 MED ORDER — APIXABAN 5 MG PO TABS: 5.0000 mg | ORAL_TABLET | Freq: Two times a day (BID) | ORAL | Status: DC

## 2021-11-02 NOTE — Plan of Care (Signed)

## 2021-11-02 NOTE — TOC Benefit Eligibility Note (Signed)
Patient Advocate Encounter  Insurance verification completed.    The patient is currently admitted and upon discharge could be taking Eliquis 5 mg.  The current 30 day co-pay is, $45.00.   The patient is currently admitted and upon discharge could be taking Xarelto 20 mg.  The current 30 day co-pay is, $45.00.   The patient is insured through [employer] Part D     Shahir Karen, CPhT Pharmacy Patient Advocate Specialist [facility] Patient Advocate Team Direct Number: [phone]  Fax: [phone]        

## 2021-11-02 NOTE — Progress Notes (Signed)
ANTICOAGULATION CONSULT NOTE  ? ?Pharmacy Consult for heparin ?Indication: pulmonary embolus and DVT ? ?No Known Allergies ? ?Patient Measurements: ?Height: '5\' 10"'$  (177.8 cm) ?Weight: 85.8 kg (189 lb 2.5 oz) ?IBW/kg (Calculated) : 73 ?Heparin Dosing Weight: 84.8kg ? ?Vital Signs: ?Temp: 98.1 ?F (36.7 ?C) (03/16 6759) ?Temp Source: Oral (03/16 1638) ?BP: 133/79 (03/16 4665) ?Pulse Rate: 79 (03/16 9935) ? ?Labs: ?Recent Labs  ?  10/31/21 ?1227 10/31/21 ?1502 10/31/21 ?2309 11/01/21 ?7017 11/01/21 ?1504 11/02/21 ?0345  ?HGB 15.0  --   --  13.8  --  13.6  ?HCT 43.2  --   --  41.5  --  40.0  ?PLT 215  --   --  216  --  228  ?LABPROT  --   --   --  13.8  --   --   ?INR  --   --   --  1.1  --   --   ?HEPARINUNFRC  --   --    < > 0.50 0.58 0.33  ?CREATININE 1.30*  --   --  1.24  --  1.51*  ?TROPONINIHS 6 6  --   --   --   --   ? < > = values in this interval not displayed.  ? ? ? ?Estimated Creatinine Clearance: 46.3 mL/min (A) (by C-G formula based on SCr of 1.51 mg/dL (H)). ? ? ?Medical History: ?Past Medical History:  ?Diagnosis Date  ? Diverticula, bladder   ? Frequency of urination   ? ? ?Medications:  ?Infusions:  ? ? ?Assessment: ?70 yom presented to the ED with CP. Found to have a PE and DVT. To start IV heparin. Baseline CBC is WNL and he was not on anticoagulation PTA.  ? ?11/02/2021: ?Daily heparin level lower but remains therapeutic this AM ?CBC stable WNL ?No bleeding or infusion related issues reported by RN ?Transition to Eliquis this AM ? ?  ?Plan:  ?Start Eliquis 10 mg PO bid x 7 days, followed by 5 mg PO bid thereafter ?Stop heparin with first dose of Eliquis  ?Pharmacy to provide Eliquis education and coupon prior to discharge ? ? ?Reuel Boom, PharmD, BCPS ?[phone] ?11/02/2021, 8:12 AM ? ? ? ?

## 2021-11-02 NOTE — Progress Notes (Signed)
AVS and discharge instructions reviewed w/ patient and wife at the bedside. Pt. Given an additional dose of potassium early per verbal request of MD before discharge. New medication pt. will start at home is Eliquis 10 mg PO bid x 7 days, followed by 5 mg PO bid thereafter. Patient and wife verbalized understanding and had no further questions. ?

## 2021-11-02 NOTE — Discharge Summary (Signed)
?Physician Discharge Summary ?  ?Patient: Brian Kemp MRN: [id number] DOB: 29-Apr-1951  ?Admit date:     10/31/2021  ?Discharge date: 11/02/21  ?Discharge Physician: [name]  ? ?PCP: [facility]  ? ?Recommendations at discharge:  ? ? Follow up PCP in 1 week ? ?Discharge Diagnoses: ?Principal Problem: ?  Pulmonary embolism ([place]) ?Active Problems: ?  Chronic kidney disease, stage 3a ([place]) ?  Fever ?  Leukocytosis ?  Pulmonary emboli ([place]) ?  Hypokalemia ? ?Resolved Problems: ?  * No resolved hospital problems. * ? ?Hospital Course: ? 71 y.o. male with medical history significant of BPH, CKD 3A, chronic pain, hearing loss presenting with chest pain.  Patient developed left-sided chest pain.  Also had shortness of breath with dyspnea on exertion.  In the ED venous duplex of lower extremity showed acute right lower extremity DVT.  CT chest showed PE with partial occlusion of the lobar to segmental level in all lobes consistent with pulmonary infarction, no evidence of right heart strain.  Patient was started on IV heparin. ? ?Assessment and Plan: ? ?* Pulmonary embolism ([place]) ?Acute PE/DVT ?-Patient presented with chest pain and shortness of breath ?-Found to have PE with partially occlusive lobar to segmental embolus present in all lobes; rounded subpleural groundglass opacity of superior segment right lower lobe and posterior lingula consistent with pulmonary infarction. ?-No evidence of right heart strain ?-Patient started on heparin per pharmacy ?-Hypercoagulability work-up ordered in the ED ?-Echocardiogram shows normal right ventricular pressure, mild elevation in pulmonary artery pressure ?-Heparin has been discontinued, started on Eliquis ?- Needs to follow up PCP to discuss duration of therapy.  ?- Can consider stopping after 3 months of therapy ? ?Hypokalemia ?- Potassium is 3.2 ?- Will replace potassium before discharge ? ?Chronic kidney disease, stage 3a ([place]) ?- Creatinine 1.55, at  baseline ? ?Hypertension ?- resume HCTZ and potassium ?-patient wants to discuss with his nephrologist before switching to other medication ? ? ? ?  ? ? ?Consultants:  ?Procedures performed:  ?Disposition: Home ?Diet recommendation: Regular diet ?Discharge Diet Orders (From admission, onward)  ? ?  Start     Ordered  ? 11/02/21 0000  Diet - low sodium heart healthy       ? 11/02/21 1033  ? ?  ?  ? ?  ? ?Regular diet ?DISCHARGE MEDICATION: ?Allergies as of 11/02/2021   ?No Known Allergies ?  ? ?  ?Medication List  ?  ? ?TAKE these medications   ? ?apixaban 5 MG Tabs tablet ?Commonly known as: ELIQUIS ?Take 1 tablet (5 mg total) by mouth 2 (two) times daily. Take 2 tabs (10 mg) daily for 7 days till 11/08/21 then start taking 5 mg po twice daily ?Start taking on: November 09, 2021 ?  ?ferrous sulfate 325 (65 FE) MG tablet ?Take 325 mg by mouth daily with breakfast. ?  ?Fish Oil 1000 MG Caps ?Take 1 capsule by mouth daily. ?  ?hydrochlorothiazide 12.5 MG capsule ?Commonly known as: MICROZIDE ?Take 12.5 mg by mouth daily. ?  ?Multi-Vitamins Tabs ?Take 1 tablet by mouth daily. ?  ?ondansetron 8 MG disintegrating tablet ?Commonly known as: Zofran ODT ?Take 1 tablet (8 mg total) by mouth every 8 (eight) hours as needed for nausea or vomiting. ?  ?potassium chloride SA 20 MEQ tablet ?Commonly known as: KLOR-CON M ?Take 20 mEq by mouth daily. ?  ? ?  ? ? ?Discharge Exam: ?Filed Weights  ? 10/31/21 1203 11/01/21 0600  ?  Weight: 84.8 kg 85.8 kg  ? ?General-appears in no acute distress ?Heart-S1-S2, regular, no murmur auscultated ?Lungs-clear to auscultation bilaterally, no wheezing or crackles auscultated ?Abdomen-soft, nontender, no organomegaly ?Extremities-no edema in the lower extremities ?Neuro-alert, oriented x3, no focal deficit noted ? ?Condition at discharge: good ? ?The results of significant diagnostics from this hospitalization (including imaging, microbiology, ancillary and laboratory) are listed below for reference.   ? ?Imaging Studies: ?CT Angio Chest PE W and/or Wo Contrast ? ?Result Date: 10/31/2021 ?CLINICAL DATA:  PE suspected EXAM: CT ANGIOGRAPHY CHEST WITH CONTRAST TECHNIQUE: Multidetector CT imaging of the chest was performed using the standard protocol during bolus administration of intravenous contrast. Multiplanar CT image reconstructions and MIPs were obtained to evaluate the vascular anatomy. RADIATION DOSE REDUCTION: This exam was performed according to the departmental dose-optimization program which includes automated exposure control, adjustment of the mA and/or kV according to patient size and/or use of iterative reconstruction technique. CONTRAST:  150m OMNIPAQUE IOHEXOL 350 MG/ML SOLN COMPARISON:  None. FINDINGS: Cardiovascular: Satisfactory opacification of the pulmonary arteries to the segmental level. Positive examination for pulmonary embolism with partially occlusive lobar to segmental embolus present in all lobes. Normal heart size. RV LV ratio is preserved at 0.8. No pericardial effusion. Mediastinum/Nodes: No enlarged mediastinal, hilar, or axillary lymph nodes. Thyroid gland, trachea, and esophagus demonstrate no significant findings. Lungs/Pleura: Rounded subpleural ground-glass opacity of the superior segment right lower lobe (series 5, image 51 and of the posterior lingula (series 5, image 66). No pleural effusion or pneumothorax. Upper Abdomen: No acute abnormality. Musculoskeletal: No chest wall abnormality. No acute osseous findings. Review of the MIP images confirms the above findings. IMPRESSION: 1. Positive examination for pulmonary embolism with partially occlusive lobar to segmental embolus present in all lobes. 2. Rounded subpleural ground-glass opacity of the superior segment right lower lobe and of the posterior lingula, consistent with pulmonary infarction. 3. No CT evidence of right heart strain. These results were called by telephone at the time of interpretation on 10/31/2021 at  1:37 pm to Dr. JIsla Pence, who verbally acknowledged these results. Electronically Signed   By: ADelanna AhmadiM.D.   On: 10/31/2021 13:37  ? ?UKoreaVenous Img Lower Bilateral (DVT) ? ?Result Date: 10/31/2021 ?CLINICAL DATA:  Pain left leg, PE EXAM: BILATERAL LOWER EXTREMITY VENOUS DOPPLER ULTRASOUND TECHNIQUE: Gray-scale sonography with graded compression, as well as color Doppler and duplex ultrasound were performed to evaluate the lower extremity deep venous systems from the level of the common femoral vein and including the common femoral, femoral, profunda femoral, popliteal and calf veins including the posterior tibial, peroneal and gastrocnemius veins when visible. The superficial great saphenous vein was also interrogated. Spectral Doppler was utilized to evaluate flow at rest and with distal augmentation maneuvers in the common femoral, femoral and popliteal veins. COMPARISON:  None. FINDINGS: RIGHT LOWER EXTREMITY Common Femoral Vein: No evidence of thrombus. Normal compressibility, respiratory phasicity and response to augmentation. Saphenofemoral Junction: No evidence of thrombus. Normal compressibility and flow on color Doppler imaging. Profunda Femoral Vein: No evidence of thrombus. Normal compressibility and flow on color Doppler imaging. Femoral Vein: No evidence of thrombus. Normal compressibility, respiratory phasicity and response to augmentation. Popliteal Vein: No evidence of thrombus. Normal compressibility, respiratory phasicity and response to augmentation. Calf Veins: There is no evidence of acute DVT. Superficial Great Saphenous Vein: No evidence of thrombus. Normal compressibility. Venous Reflux:  None. Other Findings:  None. LEFT LOWER EXTREMITY Common Femoral Vein: No evidence of thrombus. Normal  compressibility, respiratory phasicity and response to augmentation. Saphenofemoral Junction: No evidence of thrombus. Normal compressibility and flow on color Doppler imaging. Profunda Femoral  Vein: No evidence of thrombus. Normal compressibility and flow on color Doppler imaging. Femoral Vein: No evidence of thrombus. Normal compressibility, respiratory phasicity and response to augmentation. [place]

## 2021-11-02 NOTE — Discharge Instructions (Signed)
Information on my medicine - ELIQUIS? (apixaban) ? ?Why was Eliquis? prescribed for you? ?Eliquis? was prescribed to treat blood clots that may have been found in the veins of your legs (deep vein thrombosis) or in your lungs (pulmonary embolism) and to reduce the risk of them occurring again. ? ?What do You need to know about Eliquis? ? ?The starting dose is 10 mg (two 5 mg tablets) taken TWICE daily for the FIRST SEVEN (7) DAYS, then on (enter date)  11/09/2021  the dose is reduced to ONE 5 mg tablet taken TWICE daily.  Eliquis? may be taken with or without food.  ? ?Try to take the dose about the same time in the morning and in the evening. If you have difficulty swallowing the tablet whole please discuss with your pharmacist how to take the medication safely. ? ?Take Eliquis? exactly as prescribed and DO NOT stop taking Eliquis? without talking to the doctor who prescribed the medication.  Stopping may increase your risk of developing a new blood clot.  Refill your prescription before you run out. ? ?After discharge, you should have regular check-up appointments with your healthcare provider that is prescribing your Eliquis?. ?   ?What do you do if you miss a dose? ?If a dose of ELIQUIS? is not taken at the scheduled time, take it as soon as possible on the same day and twice-daily administration should be resumed. The dose should not be doubled to make up for a missed dose. ? ?Important Safety Information ?A possible side effect of Eliquis? is bleeding. You should call your healthcare provider right away if you experience any of the following: ?Bleeding from an injury or your nose that does not stop. ?Unusual colored urine (red or dark brown) or unusual colored stools (red or black). ?Unusual bruising for unknown reasons. ?A serious fall or if you hit your head (even if there is no bleeding). ? ?Some medicines may interact with Eliquis? and might increase your risk of bleeding or clotting while on Eliquis?Marland Kitchen To  help avoid this, consult your healthcare provider or pharmacist prior to using any new prescription or non-prescription medications, including herbals, vitamins, non-steroidal anti-inflammatory drugs (NSAIDs) and supplements. ? ?This website has more information on Eliquis? (apixaban): http://www.eliquis.com/eliquis/home  ?

## 2021-11-03 LAB — PROTEIN C, TOTAL: Protein C, Total: 96 % (ref 60–150)

## 2021-11-06 LAB — PROTHROMBIN GENE MUTATION

## 2021-11-06 LAB — FACTOR 5 LEIDEN

## 2021-11-27 ENCOUNTER — Telehealth: Payer: Self-pay | Admitting: Oncology

## 2021-11-27 NOTE — Telephone Encounter (Signed)
Scheduled appt per 4/10 referral. Pt is aware of appt date and time. Pt is aware to arrive 15 mins prior to appt time and to bring and updated insurance card. Pt is aware of appt location.   ?

## 2021-12-12 ENCOUNTER — Inpatient Hospital Stay: Payer: No Typology Code available for payment source | Attending: Oncology | Admitting: Oncology

## 2021-12-12 ENCOUNTER — Other Ambulatory Visit: Payer: Self-pay

## 2021-12-12 VITALS — BP 158/97 | HR 69 | Temp 97.3°F | Resp 15 | Wt 189.6 lb

## 2021-12-12 DIAGNOSIS — Z7901 Long term (current) use of anticoagulants: Secondary | ICD-10-CM | POA: Diagnosis not present

## 2021-12-12 DIAGNOSIS — I82452 Acute embolism and thrombosis of left peroneal vein: Secondary | ICD-10-CM | POA: Insufficient documentation

## 2021-12-12 DIAGNOSIS — I2699 Other pulmonary embolism without acute cor pulmonale: Secondary | ICD-10-CM | POA: Insufficient documentation

## 2021-12-12 DIAGNOSIS — Z79899 Other long term (current) drug therapy: Secondary | ICD-10-CM | POA: Insufficient documentation

## 2021-12-12 DIAGNOSIS — N189 Chronic kidney disease, unspecified: Secondary | ICD-10-CM | POA: Diagnosis not present

## 2021-12-12 DIAGNOSIS — D6862 Lupus anticoagulant syndrome: Secondary | ICD-10-CM | POA: Diagnosis not present

## 2021-12-12 DIAGNOSIS — I129 Hypertensive chronic kidney disease with stage 1 through stage 4 chronic kidney disease, or unspecified chronic kidney disease: Secondary | ICD-10-CM | POA: Insufficient documentation

## 2021-12-12 DIAGNOSIS — I82432 Acute embolism and thrombosis of left popliteal vein: Secondary | ICD-10-CM | POA: Diagnosis not present

## 2021-12-12 NOTE — Progress Notes (Signed)
?Reason for the request:    Pulmonary embolism ? ?HPI: I was asked by Dr. Sherral Hammers to evaluate Brian Kemp for the evaluation of venous thromboembolism.  He is a 71 year old gentleman with history of hypertension and chronic kidney disease who presented on October 31, 2021 with chest pain on the left side.  His evaluation included a CT scan of the chest which showed pulmonary embolism with partial occlusive lobar to segmental embolus in all lobes.  Subpleural ground glass opacity superior segment of the right lobe was also noted consistent with infarction.  Acute deep vein thrombosis was also noted in the left popliteal and left peroneal vein on lower extremity Dopplers.  There is no DVT in the right lower extremity.  He was started on heparin and subsequently was discharged on November 02, 2021 on Eliquis.  Hypercoagulable panel obtained upon evaluation and work-up was negative for factor V Leiden mutation, prothrombin gene mutation, protein S or protein C deficiency.  He did have a positive lupus anticoagulant.  Anticardiolipin antibodies were all within normal range.  Beta-2 glycoprotein antibody were also within normal range.  Based on these findings I was asked to comment about these findings.  Clinically, he reports feeling well without any major complaints.  He does have left lower extremity pain but no chest pain or shortness of breath.  He denies any dyspnea on exertion.  He denies any previous thrombosis episodes.  He has reported that his lower extremity pain and dyspnea started a few months before his presentation in March. ? ?He does not report any headaches, blurry vision, syncope or seizures. Does not report any fevers, chills or sweats.  Does not report any cough, wheezing or hemoptysis.  Does not report any chest pain, palpitation, orthopnea or leg edema.  Does not report any nausea, vomiting or abdominal pain.  Does not report any constipation or diarrhea.  Does not report any skeletal complaints.    Does  not report frequency, urgency or hematuria.  Does not report any skin rashes or lesions. Does not report any heat or cold intolerance.  Does not report any lymphadenopathy or petechiae.  Does not report any anxiety or depression.  Remaining review of systems is negative.  ? ? ? ?Past Medical History:  ?Diagnosis Date  ? Diverticula, bladder   ? Frequency of urination   ?: ? ? ?Past Surgical History:  ?Procedure Laterality Date  ? CYSTOSCOPY  06/05/2012  ? Procedure: CYSTOSCOPY;  Surgeon: Ailene Rud, MD;  Location: [facility];  Service: Urology;  Laterality: N/A;  C-ARM ?  ? HERNIA REPAIR  AGE 28  ? [place]  ? [place]  ? REMOVAL OF SHRAPNEL RIGHT EYE  1971  ? TRANSURETHRAL RESECTION OF PROSTATE  07/07/2012  ? Procedure: TRANSURETHRAL RESECTION OF THE PROSTATE WITH GYRUS INSTRUMENTS;  Surgeon: Ailene Rud, MD;  Location: [facility];  Service: Urology;  Laterality: N/A;  ?: ? ? ?Current Outpatient Medications:  ?  apixaban (ELIQUIS) 5 MG TABS tablet, Take 1 tablet (5 mg total) by mouth 2 (two) times daily. Take 2 tabs (10 mg) daily for 7 days till 11/08/21 then start taking 5 mg po twice daily, Disp: 60 tablet, Rfl: 3 ?  ferrous sulfate 325 (65 FE) MG tablet, Take 325 mg by mouth daily with breakfast., Disp: , Rfl:  ?  hydrochlorothiazide (MICROZIDE) 12.5 MG capsule, Take 12.5 mg by mouth daily., Disp: ,  Rfl:  ?  Multiple Vitamin (MULTI-VITAMINS) TABS, Take 1 tablet by mouth daily., Disp: , Rfl:  ?  Omega-3 Fatty Acids (FISH OIL) 1000 MG CAPS, Take 1 capsule by mouth daily., Disp: , Rfl:  ?  ondansetron (ZOFRAN ODT) 8 MG disintegrating tablet, Take 1 tablet (8 mg total) by mouth every 8 (eight) hours as needed for nausea or vomiting. (Patient not taking: Reported on 11/01/2021), Disp: 12 tablet, Rfl: 0 ?  potassium chloride SA (KLOR-CON M) 20 MEQ tablet, Take 20 mEq by mouth daily., Disp: , Rfl: : ? ?No Known Allergies: ? ?No family  history on file.: ? ? ?Social History  ? ?Socioeconomic History  ? Marital status: Married  ?  Spouse name: Not on file  ? Number of children: Not on file  ? Years of education: Not on file  ? Highest education level: Not on file  ?Occupational History  ? Not on file  ?Tobacco Use  ? Smoking status: Never  ? Smokeless tobacco: Never  ?Substance and Sexual Activity  ? Alcohol use: Yes  ?  Comment: occ  ? Drug use: No  ? Sexual activity: Not on file  ?Other Topics Concern  ? Not on file  ?Social History Narrative  ? Not on file  ? ?Social Determinants of Health  ? ?Financial Resource Strain: Not on file  ?Food Insecurity: Not on file  ?Transportation Needs: Not on file  ?Physical Activity: Not on file  ?Stress: Not on file  ?Social Connections: Not on file  ?Intimate Partner Violence: Not on file  ?: ? ?Pertinent items are noted in HPI. ? ?Exam: ?Blood pressure (!) 158/97, pulse 69, temperature (!) 97.3 ?F (36.3 ?C), temperature source Temporal, resp. rate 15, weight 189 lb 9.6 oz (86 kg), SpO2 99 %. ?ECOG 0 ?General appearance: alert and cooperative appeared without distress. ?Head: atraumatic without any abnormalities. ?Eyes: conjunctivae/corneas clear. PERRL.  Sclera anicteric. ?Throat: lips, mucosa, and tongue normal; without oral thrush or ulcers. ?Resp: clear to auscultation bilaterally without rhonchi, wheezes or dullness to percussion. ?Cardio: regular rate and rhythm, S1, S2 normal, no murmur, click, rub or gallop ?GI: soft, non-tender; bowel sounds normal; no masses,  no organomegaly ?Skin: Skin color, texture, turgor normal. No rashes or lesions ?Lymph nodes: Cervical, supraclavicular, and axillary nodes normal. ?Neurologic: Grossly normal without any motor, sensory or deep tendon reflexes. ?Musculoskeletal: No joint deformity or effusion. ? ? ?Assessment and Plan:  ? ?71 year old with: ? ?1.  Venous thromboembolism diagnosed in March 2023.  He was diagnosed with pulmonary embolism as well as deep vein  thrombosis that appears to be unprovoked.  He is currently on Eliquis with a hypercoagulable panel showed a positive lupus anticoagulant. ? ?The natural course of inherited and acquired thrombophilia were discussed.  Recurrent venous thromboembolism and management choices were reviewed.  Given the fact that he is presumably unprovoked thrombosis and associated with positive lupus anticoagulant, I have recommended long-term anticoagulation.  Complications associated with long-term anticoagulation includes bleeding, cost among others were reiterated.  Given his increased risk of recurrent thrombosis it would be reasonable to extend his anticoagulation possibly lifetime. ? ?Positive lupus anticoagulant could increase his risk of thrombosis.  This could be a real finding versus a possibility of a false positive.  I recommended repeat testing in 6 months and reevaluate the duration of anticoagulation at that time.  For the time being I recommendation is to continue anticoagulation indefinitely. ? ?After discussion today, he is agreeable with this plan.  We will continue on Eliquis unless there is a issue of bleeding that arise in the future. ? ?2.  Follow-up: In 6 months for repeat follow-up. ? ? ? ?45  minutes were dedicated to this visit. The time was spent on reviewing laboratory data, imaging studies, discussing treatment options, discussing differential diagnosis and answering questions regarding future plan. ? ? ?  A copy of this consult has been forwarded to the requesting physician. ? ?

## 2021-12-15 ENCOUNTER — Emergency Department (HOSPITAL_BASED_OUTPATIENT_CLINIC_OR_DEPARTMENT_OTHER)
Admission: EM | Admit: 2021-12-15 | Discharge: 2021-12-15 | Disposition: A | Discharge status: Home / Self Care | Payer: No Typology Code available for payment source | Attending: Emergency Medicine | Admitting: Emergency Medicine

## 2021-12-15 ENCOUNTER — Emergency Department (HOSPITAL_BASED_OUTPATIENT_CLINIC_OR_DEPARTMENT_OTHER): Payer: No Typology Code available for payment source

## 2021-12-15 ENCOUNTER — Encounter (HOSPITAL_BASED_OUTPATIENT_CLINIC_OR_DEPARTMENT_OTHER): Payer: Self-pay

## 2021-12-15 ENCOUNTER — Other Ambulatory Visit: Payer: Self-pay

## 2021-12-15 DIAGNOSIS — Z79899 Other long term (current) drug therapy: Secondary | ICD-10-CM | POA: Insufficient documentation

## 2021-12-15 DIAGNOSIS — M79605 Pain in left leg: Secondary | ICD-10-CM | POA: Diagnosis not present

## 2021-12-15 DIAGNOSIS — Z7902 Long term (current) use of antithrombotics/antiplatelets: Secondary | ICD-10-CM | POA: Insufficient documentation

## 2021-12-15 DIAGNOSIS — M542 Cervicalgia: Secondary | ICD-10-CM | POA: Diagnosis present

## 2021-12-15 HISTORY — DX: Unspecified kidney failure: N19

## 2021-12-15 HISTORY — DX: Other pulmonary embolism without acute cor pulmonale: I26.99

## 2021-12-15 LAB — BASIC METABOLIC PANEL
Anion gap: 9 (ref 5–15)
BUN: 24 mg/dL — ABNORMAL HIGH (ref 8–23)
CO2: 28 mmol/L (ref 22–32)
Calcium: 9.2 mg/dL (ref 8.9–10.3)
Chloride: 98 mmol/L (ref 98–111)
Creatinine, Ser: 1.31 mg/dL — ABNORMAL HIGH (ref 0.61–1.24)
GFR, Estimated: 58 mL/min — ABNORMAL LOW (ref >60)
Glucose, Bld: 90 mg/dL (ref 70–99)
Potassium: 3.3 mmol/L — ABNORMAL LOW (ref 3.5–5.1)
Sodium: 135 mmol/L (ref 135–145)

## 2021-12-15 LAB — CBC
HCT: 44.5 % (ref 39.0–52.0)
Hemoglobin: 15.4 g/dL (ref 13.0–17.0)
MCH: 31.5 pg (ref 26.0–34.0)
MCHC: 34.6 g/dL (ref 30.0–36.0)
MCV: 91 fL (ref 80.0–100.0)
Platelets: 210 10*3/uL (ref 150–400)
RBC: 4.89 MIL/uL (ref 4.22–5.81)
RDW: 13.2 % (ref 11.5–15.5)
WBC: 7.7 10*3/uL (ref 4.0–10.5)
nRBC: 0 % (ref 0.0–0.2)

## 2021-12-15 MED ORDER — CYCLOBENZAPRINE HCL 10 MG PO TABS: 10.0000 mg | ORAL_TABLET | Freq: Two times a day (BID) | ORAL | 0 refills | Status: AC | PRN

## 2021-12-15 MED ORDER — IOHEXOL 350 MG/ML SOLN
80.0000 mL | Freq: Once | INTRAVENOUS | Status: AC | PRN
  Administered 2021-12-15: 80 mL via INTRAVENOUS

## 2021-12-15 NOTE — ED Notes (Signed)
Pt. Reports several weeks ago was admitted for blood clots in the L leg behind the L knee.  Pt. Now on blood thinner and has had pain in the R neck.  Pt. Dr. Rockey Situ him to go to ED and have CT study done and blood work due to symptoms. ?

## 2021-12-15 NOTE — ED Provider Notes (Signed)
?[name] ?Provider Note ? ? ?CSN: [id number] ?Arrival date & time: 12/15/21  1238 ? ?  ? ?History ? ?Chief Complaint  ?Patient presents with  ? Neck Pain  ? Leg Pain  ? ? ?Brian Kemp is a 71 y.o. male. ? ?Patient with history of chronic kidney disease, PE diagnosed in March 2023 with associated DVT, hypercoagulable work-up with positive lupus anticoagulant, hematology recommended lifelong anticoagulation --presents to the emergency department today for evaluation of right-sided neck pain and left lower extremity pain.  Patient states that the symptoms started about a week ago.  He was concerned because last time he ignored his symptoms and he ended up having a blood clot.  He states that his symptoms including shortness of breath, markedly improved after couple days in the hospital.  Pain in the leg is in the left calf.  He does report walking several miles per day.  No associated swelling or redness.  No weakness, numbness, or tingling in the leg or foot.  He is strictly compliant with his anticoagulation without missed doses.  In addition he states that the neck pain began after driving.  Sometimes he will get a "nerve pain" in his shoulder associated with driving.  This is worse with movement.  This has gradually improved over the past several days but has not resolved.  Currently he just has pain in the anterior right neck.  No associated swelling.  No strokelike symptoms, vision loss.  No upper extremity symptoms including weakness. ? ? ?  ? ?Home Medications ?Prior to Admission medications   ?Medication Sig Start Date End Date Taking? Authorizing Provider  ?apixaban (ELIQUIS) 5 MG TABS tablet Take 1 tablet (5 mg total) by mouth 2 (two) times daily. Take 2 tabs (10 mg) daily for 7 days till 11/08/21 then start taking 5 mg po twice daily 11/09/21   [name], MD  ?ferrous sulfate 325 (65 FE) MG tablet Take 325 mg by mouth daily with breakfast.    [provider]   ?hydrochlorothiazide (MICROZIDE) 12.5 MG capsule Take 12.5 mg by mouth daily.    [provider]  ?Multiple Vitamin (MULTI-VITAMINS) TABS Take 1 tablet by mouth daily. 10/24/13   [provider]  ?Omega-3 Fatty Acids (FISH OIL) 1000 MG CAPS Take 1 capsule by mouth daily.    [provider]  ?ondansetron (ZOFRAN ODT) 8 MG disintegrating tablet Take 1 tablet (8 mg total) by mouth every 8 (eight) hours as needed for nausea or vomiting. 01/13/16   [name], MD  ?potassium chloride SA (KLOR-CON M) 20 MEQ tablet Take 20 mEq by mouth daily.    [provider]  ?   ? ?Allergies    ?Patient has no known allergies.   ? ?Review of Systems   ?Review of Systems ? ?Physical Exam ?Updated Vital Signs ?BP (!) 146/84   Pulse 64   Temp 98.3 ?F (36.8 ?C) (Oral)   Resp 18   Ht '5\' 10"'$  (1.778 m)   Wt 83 kg   SpO2 97%   BMI 26.26 kg/m?  ?Physical Exam ?Vitals and nursing note reviewed.  ?Constitutional:   ?   General: He is not in acute distress. ?   Appearance: He is well-developed.  ?HENT:  ?   Head: Normocephalic and atraumatic.  ?Eyes:  ?   General:     ?   Right eye: No discharge.     ?   Left eye: No discharge.  ?  Conjunctiva/sclera: Conjunctivae normal.  ?Neck:  ?   Comments: No carotid bruit.  No obvious swelling. ?Cardiovascular:  ?   Rate and Rhythm: Normal rate and regular rhythm.  ?   Heart sounds: Normal heart sounds.  ?   Comments: No abnormal or irregular rhythms. ?Pulmonary:  ?   Effort: Pulmonary effort is normal.  ?   Breath sounds: Normal breath sounds.  ?Abdominal:  ?   Palpations: Abdomen is soft.  ?   Tenderness: There is no abdominal tenderness.  ?Musculoskeletal:  ?   Cervical back: Normal range of motion and neck supple.  ?   Right lower leg: No edema.  ?   Left lower leg: No edema.  ?   Comments: No edema but patient reports mild left calf tenderness to palpation posteriorly.  No erythema, warmth, signs of cellulitis or abscess.  ?Skin: ?   General: Skin is warm and  dry.  ?Neurological:  ?   Mental Status: He is alert.  ? ? ?ED Results / Procedures / Treatments   ?Labs ?(all labs ordered are listed, but only abnormal results are displayed) ?Labs Reviewed  ?CBC  ?BASIC METABOLIC PANEL  ? ? ?EKG ?None ? ?Radiology ?No results found. ? ?Procedures ?Procedures  ? ? ?Medications Ordered in ED ?Medications - No data to display ? ?ED Course/ Medical Decision Making/ A&P ?  ? ?Patient seen and examined. History obtained directly from patient.  ? ?Labs/EKG: Ordered CBC, BMP ? ?Imaging: Ordered ultrasound of the left lower extremity to evaluate for clot ? ?Medications/Fluids: None ordered ? ?Most recent vital signs reviewed and are as follows: ?BP (!) 146/84   Pulse 64   Temp 98.3 ?F (36.8 ?C) (Oral)   Resp 18   Ht '5\' 10"'$  (1.778 m)   Wt 83 kg   SpO2 97%   BMI 26.26 kg/m?  ? ?Initial impression: Right neck pain, left calf pain after recent PE, compliant with anticoagulation ? ?Patient discussed with and seen by Dr. Pearline Cables.  We will proceed with CT imaging to evaluate the neck.  Initially we were going to scan the chest and neck, however CT is unable to perform angiography of the chest and neck simultaneously.  For this reason, will obtain CTA of the neck only.  No chest pain, shortness of breath or hypoxia or tachypnea.  Awaiting ultrasound. ? ?4:33 PM Reassessment performed. Patient appears  ? ?Labs personally reviewed and interpreted including: CBC unremarkable; BMP with mildly low potassium at 3.3, serum creatinine at baseline 1.31. ? ?Imaging personally visualized and interpreted including: CT angiography of the neck negative for acute findings.  Lower extremity ultrasound read as negative. ? ?Reviewed pertinent lab work and imaging with patient at bedside. Questions answered.  ? ?Most current vital signs reviewed and are as follows: ?BP (!) 145/78   Pulse 70   Temp 98.3 ?F (36.8 ?C) (Oral)   Resp 18   Ht '5\' 10"'$  (1.778 m)   Wt 83 kg   SpO2 99%   BMI 26.26 kg/m?  ? ?Plan:  Discharge to home.  ? ?Prescriptions written for: Flexeril.  Encouraged them to use under supervision, lowest dose necessary, at bedtime. ? ?Other home care instructions discussed: Heating pad, gentle stretches, tylenol. ? ?ED return instructions discussed: Return with any strokelike symptoms, swelling, fevers or other concerns. ? ?Follow-up instructions discussed: Patient encouraged to follow-up with their PCP as planned. ? ? ?                        ?  Medical Decision Making ?Amount and/or Complexity of Data Reviewed ?Labs: ordered. ?Radiology: ordered. ? ? ?Patient with recent PE, on anticoagulation, presents with right-sided neck pain without complicating factors or other concerning symptoms.  Likely musculoskeletal.  CTA of the neck was reassuring.  Patient also with left calf pain.  This shows improvement in the previously visualized DVT.  No signs of cellulitis.  No neurologic deficits or changes.  Neck symptoms are most likely MSK or neuropathic in nature.  Agree with current therapy. ? ?The patient's vital signs, pertinent lab work and imaging were reviewed and interpreted as discussed in the ED course. Hospitalization was considered for further testing, treatments, or serial exams/observation. However as patient is well-appearing, has a stable exam, and reassuring studies today, I do not feel that they warrant admission at this time. This plan was discussed with the patient who verbalizes agreement and comfort with this plan and seems reliable and able to return to the Emergency Department with worsening or changing symptoms.  ? ? ? ? ? ? ? ? ?Final Clinical Impression(s) / ED Diagnoses ?Final diagnoses:  ?Neck pain  ?Pain of left lower extremity  ? ? ?Rx / DC Orders ?ED Discharge Orders   ? ?      Ordered  ?  cyclobenzaprine (FLEXERIL) 10 MG tablet  2 times daily PRN       ? 12/15/21 1632  ? ?  ?  ? ?  ? ? ?  ?Carlisle Cater, PA-C ?12/15/21 1638 ? ?  ?Jeanell Sparrow, DO ?12/17/21 1916 ? ?

## 2021-12-15 NOTE — ED Notes (Signed)
Patient denies pain and is resting comfortably.  

## 2021-12-15 NOTE — ED Triage Notes (Signed)
Patient states he was recently diagnosed with a PE. Patient states the last 10 days he has had carotid pain and left leg discomfort. +PSCM noted in triage. Patient is on blood thinners.  ?

## 2021-12-15 NOTE — Discharge Instructions (Signed)
The CT scan of the neck and blood vessels in the neck does not show any signs of problems today. ? ?The ultrasound of your left lower extremity shows resolution of the blood clot seen in March. ? ?Your blood cell counts and kidney function appear at baseline without any significant changes. ? ?Please follow-up with your doctor as planned.  Use the muscle relaxer, if desired, at bedtime.  This can cause dizziness and falls, so use the lowest dose needed under supervision. ? ? ?

## 2021-12-15 NOTE — ED Notes (Signed)
Report given to Misty RN 

## 2021-12-18 DIAGNOSIS — M25531 Pain in right wrist: Secondary | ICD-10-CM | POA: Diagnosis not present

## 2021-12-25 DIAGNOSIS — M25531 Pain in right wrist: Secondary | ICD-10-CM | POA: Diagnosis not present

## 2021-12-25 DIAGNOSIS — M13831 Other specified arthritis, right wrist: Secondary | ICD-10-CM | POA: Diagnosis not present

## 2021-12-25 DIAGNOSIS — R52 Pain, unspecified: Secondary | ICD-10-CM | POA: Diagnosis not present

## 2021-12-25 DIAGNOSIS — M13849 Other specified arthritis, unspecified hand: Secondary | ICD-10-CM | POA: Diagnosis not present

## 2022-03-01 ENCOUNTER — Telehealth: Payer: Self-pay | Admitting: Oncology

## 2022-03-01 NOTE — Telephone Encounter (Signed)
Called patient regarding upcoming September appointments, patient has been called and notified. 

## 2022-03-21 ENCOUNTER — Telehealth: Payer: Self-pay | Admitting: Oncology

## 2022-03-21 NOTE — Telephone Encounter (Signed)
R/s per 8/1 in basket, pt has been called and confirmed

## 2022-05-01 ENCOUNTER — Other Ambulatory Visit: Payer: No Typology Code available for payment source

## 2022-05-01 ENCOUNTER — Ambulatory Visit: Payer: No Typology Code available for payment source | Admitting: Oncology

## 2022-05-11 ENCOUNTER — Other Ambulatory Visit: Payer: Self-pay

## 2022-05-11 ENCOUNTER — Inpatient Hospital Stay (HOSPITAL_BASED_OUTPATIENT_CLINIC_OR_DEPARTMENT_OTHER): Payer: No Typology Code available for payment source | Admitting: Oncology

## 2022-05-11 ENCOUNTER — Inpatient Hospital Stay: Payer: No Typology Code available for payment source | Attending: Oncology

## 2022-05-11 VITALS — BP 145/84 | HR 57 | Temp 98.0°F | Resp 18 | Ht 70.0 in | Wt 185.3 lb

## 2022-05-11 DIAGNOSIS — D6862 Lupus anticoagulant syndrome: Secondary | ICD-10-CM | POA: Insufficient documentation

## 2022-05-11 DIAGNOSIS — Z7901 Long term (current) use of anticoagulants: Secondary | ICD-10-CM | POA: Diagnosis not present

## 2022-05-11 DIAGNOSIS — I82409 Acute embolism and thrombosis of unspecified deep veins of unspecified lower extremity: Secondary | ICD-10-CM | POA: Diagnosis not present

## 2022-05-11 DIAGNOSIS — Z79899 Other long term (current) drug therapy: Secondary | ICD-10-CM | POA: Insufficient documentation

## 2022-05-11 DIAGNOSIS — I2699 Other pulmonary embolism without acute cor pulmonale: Secondary | ICD-10-CM | POA: Diagnosis not present

## 2022-05-11 NOTE — Progress Notes (Signed)
Hematology and Oncology Follow Up Visit  Brian Kemp [id number] February 11, 1951 71 y.o. 05/11/2022 8:33 AM Clinic, [facility] [place]   Principle Diagnosis: 71 year old with pulmonary embolism and deep vein thrombosis diagnosed in March 2023.  This was in the setting of a positive lupus anticoagulant and an provoked setting.     Current therapy: Full dose anticoagulation with Eliquis started in March 2023.  Interim History: [name] returns today for a follow-up.  Since the last visit, he reports no major changes in his health.  He continues to tolerate Eliquis without any major complaints.  He denies any nausea, vomiting or abdominal pain.  He denies any hematochezia or melena.  He denies any hemoptysis or hematemesis.     Medications: I have reviewed the patient's current medications.  Current Outpatient Medications  Medication Sig Dispense Refill   apixaban (ELIQUIS) 5 MG TABS tablet Take 1 tablet (5 mg total) by mouth 2 (two) times daily. Take 2 tabs (10 mg) daily for 7 days till 11/08/21 then start taking 5 mg po twice daily 60 tablet 3   cyclobenzaprine (FLEXERIL) 10 MG tablet Take 1 tablet (10 mg total) by mouth 2 (two) times daily as needed for muscle spasms. 14 tablet 0   ferrous sulfate 325 (65 FE) MG tablet Take 325 mg by mouth daily with breakfast.     hydrochlorothiazide (MICROZIDE) 12.5 MG capsule Take 12.5 mg by mouth daily.     Multiple Vitamin (MULTI-VITAMINS) TABS Take 1 tablet by mouth daily.     Omega-3 Fatty Acids (FISH OIL) 1000 MG CAPS Take 1 capsule by mouth daily.     ondansetron (ZOFRAN ODT) 8 MG disintegrating tablet Take 1 tablet (8 mg total) by mouth every 8 (eight) hours as needed for nausea or vomiting. 12 tablet 0   potassium chloride SA (KLOR-CON M) 20 MEQ tablet Take 20 mEq by mouth daily.     No current facility-administered medications for this visit.     Allergies: No Known Allergies    Physical Exam: Blood pressure (!)  145/84, pulse (!) 57, temperature 98 F (36.7 C), temperature source Temporal, resp. rate 18, height '5\' 10"'$  (1.778 m), weight 185 lb 4.8 oz (84.1 kg), SpO2 100 %.  ECOG: 0    General appearance: Comfortable appearing without any discomfort Head: Normocephalic without any trauma Oropharynx: Mucous membranes are moist and pink without any thrush or ulcers. Eyes: Pupils are equal and round reactive to light. Lymph nodes: No cervical, supraclavicular, inguinal or axillary lymphadenopathy.   Heart:regular rate and rhythm.  S1 and S2 without leg edema. Lung: Clear without any rhonchi or wheezes.  No dullness to percussion. Abdomin: Soft, nontender, nondistended with good bowel sounds.  No hepatosplenomegaly. Musculoskeletal: No joint deformity or effusion.  Full range of motion noted. Neurological: No deficits noted on motor, sensory and deep tendon reflex exam. Skin: No petechial rash or dryness.  Appeared moist.     Lab Results: Lab Results  Component Value Date   WBC 7.7 12/15/2021   HGB 15.4 12/15/2021   HCT 44.5 12/15/2021   MCV 91.0 12/15/2021   PLT 210 12/15/2021     Chemistry      Component Value Date/Time   NA 135 12/15/2021 1501   K 3.3 (L) 12/15/2021 1501   CL 98 12/15/2021 1501   CO2 28 12/15/2021 1501   BUN 24 (H) 12/15/2021 1501   CREATININE 1.31 (H) 12/15/2021 1501      Component Value Date/Time  CALCIUM 9.2 12/15/2021 1501   ALKPHOS 46 11/01/2021 0711   AST 13 (L) 11/01/2021 0711   ALT 12 11/01/2021 0711   BILITOT 0.6 11/01/2021 0711         Impression and Plan:  71 year old with:  1.  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  His venous thromboembolism was felt to be unprovoked in the setting of a positive lupus anticoagulant.   His disease status is updated at this time and treatment choices were reviewed.  Risks and benefits of continuing Eliquis were discussed.  Complications that include bleeding including GU, GI  or mucosal.  Given the unprovoked nature of his thrombosis as well as positive lupus anticoagulant I recommended lifetime anticoagulation unless contraindication exist in the future.  I have updated his lupus anticoagulant testing today.   He is agreeable to proceed with this plan.   2.  Follow-up: I am happy to see him in the future as needed.       45  minutes were spent on this encounter.  The time was dedicated to reviewing his disease status, treatment choices and addressing complications related to his therapy.      Zola Button, MD 9/22/20238:33 AM

## 2022-05-13 LAB — LUPUS ANTICOAGULANT PANEL
DRVVT: 55.6 s — ABNORMAL HIGH (ref 0.0–47.0)
PTT Lupus Anticoagulant: 32.3 s (ref 0.0–43.5)

## 2022-05-13 LAB — DRVVT CONFIRM: dRVVT Confirm: 1 ratio (ref 0.8–1.2)

## 2022-05-13 LAB — DRVVT MIX: dRVVT Mix: 43.8 s — ABNORMAL HIGH (ref 0.0–40.4)

## 2022-06-07 ENCOUNTER — Encounter (HOSPITAL_BASED_OUTPATIENT_CLINIC_OR_DEPARTMENT_OTHER): Payer: Self-pay

## 2022-06-07 ENCOUNTER — Emergency Department (HOSPITAL_BASED_OUTPATIENT_CLINIC_OR_DEPARTMENT_OTHER): Payer: No Typology Code available for payment source

## 2022-06-07 ENCOUNTER — Other Ambulatory Visit: Payer: Self-pay

## 2022-06-07 ENCOUNTER — Emergency Department (HOSPITAL_BASED_OUTPATIENT_CLINIC_OR_DEPARTMENT_OTHER)
Admission: EM | Admit: 2022-06-07 | Discharge: 2022-06-07 | Disposition: A | Discharge status: Home / Self Care | Payer: No Typology Code available for payment source | Attending: Emergency Medicine | Admitting: Emergency Medicine

## 2022-06-07 DIAGNOSIS — R079 Chest pain, unspecified: Secondary | ICD-10-CM | POA: Insufficient documentation

## 2022-06-07 DIAGNOSIS — R0602 Shortness of breath: Secondary | ICD-10-CM | POA: Diagnosis not present

## 2022-06-07 DIAGNOSIS — Z7901 Long term (current) use of anticoagulants: Secondary | ICD-10-CM | POA: Insufficient documentation

## 2022-06-07 LAB — TROPONIN I (HIGH SENSITIVITY)
Troponin I (High Sensitivity): 6 ng/L (ref <18)
Troponin I (High Sensitivity): 6 ng/L (ref <18)

## 2022-06-07 LAB — BASIC METABOLIC PANEL
Anion gap: 11 (ref 5–15)
BUN: 21 mg/dL (ref 8–23)
CO2: 25 mmol/L (ref 22–32)
Calcium: 9.8 mg/dL (ref 8.9–10.3)
Chloride: 101 mmol/L (ref 98–111)
Creatinine, Ser: 1.49 mg/dL — ABNORMAL HIGH (ref 0.61–1.24)
GFR, Estimated: 50 mL/min — ABNORMAL LOW (ref >60)
Glucose, Bld: 96 mg/dL (ref 70–99)
Potassium: 3.8 mmol/L (ref 3.5–5.1)
Sodium: 137 mmol/L (ref 135–145)

## 2022-06-07 LAB — CBC
HCT: 45.7 % (ref 39.0–52.0)
Hemoglobin: 15.8 g/dL (ref 13.0–17.0)
MCH: 32.3 pg (ref 26.0–34.0)
MCHC: 34.6 g/dL (ref 30.0–36.0)
MCV: 93.5 fL (ref 80.0–100.0)
Platelets: 178 10*3/uL (ref 150–400)
RBC: 4.89 MIL/uL (ref 4.22–5.81)
RDW: 13 % (ref 11.5–15.5)
WBC: 7.3 10*3/uL (ref 4.0–10.5)
nRBC: 0 % (ref 0.0–0.2)

## 2022-06-07 LAB — D-DIMER, QUANTITATIVE: D-Dimer, Quant: <0.27 ug/mL-FEU (ref 0.00–0.50)

## 2022-06-07 NOTE — ED Provider Notes (Signed)
[facility] Provider Note   CSN: [id number] Arrival date & time: 06/07/22  1015     History  Chief Complaint  Patient presents with   Chest Pain    Brian Kemp is a 71 y.o. male.  He has a history of PE and is on Eliquis.  For the last 2 days he has noticed some pain in his right lateral lower rib cage.  It occurred while he was walking.  He said he also felt slightly short of breath.  He was concerned it may be a recurrence of blood clot and so decided to come here for further evaluation.  He denies any abdominal pain nausea vomiting.  No cough or hemoptysis, no fevers or chills.  He does not recall any trauma to his side nor any lifting.  He has been taking his blood thinner regularly.  The history is provided by the patient.  Chest Pain Pain location:  R lateral chest Pain quality: aching   Pain radiates to:  Does not radiate Pain severity:  Mild Onset quality:  Gradual Duration:  2 days Timing:  Sporadic Progression:  Unchanged Chronicity:  New Context comment:  Exercise Relieved by:  None tried Worsened by:  Exertion Ineffective treatments:  None tried Associated symptoms: shortness of breath   Associated symptoms: no abdominal pain, no back pain, no cough, no diaphoresis, no dizziness, no fever, no nausea and no vomiting   Risk factors: prior DVT/PE   Risk factors: no smoking        Home Medications Prior to Admission medications   Medication Sig Start Date End Date Taking? Authorizing Provider  apixaban (ELIQUIS) 5 MG TABS tablet Take 1 tablet (5 mg total) by mouth 2 (two) times daily. Take 2 tabs (10 mg) daily for 7 days till 11/08/21 then start taking 5 mg po twice daily 11/09/21   [name], MD  cyclobenzaprine (FLEXERIL) 10 MG tablet Take 1 tablet (10 mg total) by mouth 2 (two) times daily as needed for muscle spasms. 12/15/21   Carlisle Cater, PA-C  ferrous sulfate 325 (65 FE) MG tablet Take 325 mg by mouth daily with  breakfast.    [provider]  hydrochlorothiazide (MICROZIDE) 12.5 MG capsule Take 12.5 mg by mouth daily.    [provider]  Multiple Vitamin (MULTI-VITAMINS) TABS Take 1 tablet by mouth daily. 10/24/13   [provider]  Omega-3 Fatty Acids (FISH OIL) 1000 MG CAPS Take 1 capsule by mouth daily.    [provider]  ondansetron (ZOFRAN ODT) 8 MG disintegrating tablet Take 1 tablet (8 mg total) by mouth every 8 (eight) hours as needed for nausea or vomiting. 01/13/16   [name], MD  potassium chloride SA (KLOR-CON M) 20 MEQ tablet Take 20 mEq by mouth daily.    [provider]      Allergies    Patient has no known allergies.    Review of Systems   Review of Systems  Constitutional:  Negative for diaphoresis and fever.  HENT:  Negative for sore throat.   Respiratory:  Positive for shortness of breath. Negative for cough.   Cardiovascular:  Positive for chest pain.  Gastrointestinal:  Negative for abdominal pain, nausea and vomiting.  Genitourinary:  Negative for dysuria.  Musculoskeletal:  Negative for back pain.  Skin:  Negative for rash.  Neurological:  Negative for dizziness.    Physical Exam Updated Vital Signs BP (!) 140/106 (BP Location: Left Arm)  Pulse 66   Temp 98.2 F (36.8 C) (Oral)   Resp 20   Ht '5\' 10"'$  (1.778 m)   Wt 81.6 kg   SpO2 99%   BMI 25.83 kg/m  Physical Exam Vitals and nursing note reviewed.  Constitutional:      General: He is not in acute distress.    Appearance: Normal appearance. He is well-developed.  HENT:     Head: Normocephalic and atraumatic.  Eyes:     Conjunctiva/sclera: Conjunctivae normal.  Cardiovascular:     Rate and Rhythm: Normal rate and regular rhythm.     Heart sounds: Normal heart sounds. No murmur heard. Pulmonary:     Effort: Pulmonary effort is normal. No respiratory distress.     Breath sounds: Normal breath sounds.  Chest:     Chest wall: No mass or crepitus.  Abdominal:      Palpations: Abdomen is soft.     Tenderness: There is no abdominal tenderness.  Musculoskeletal:        General: No swelling. Normal range of motion.     Cervical back: Neck supple.     Right lower leg: No tenderness. No edema.     Left lower leg: No tenderness. No edema.  Skin:    General: Skin is warm and dry.     Capillary Refill: Capillary refill takes less than 2 seconds.  Neurological:     General: No focal deficit present.     Mental Status: He is alert.     Sensory: No sensory deficit.     Motor: No weakness.     Gait: Gait normal.     ED Results / Procedures / Treatments   Labs (all labs ordered are listed, but only abnormal results are displayed) Labs Reviewed  BASIC METABOLIC PANEL - Abnormal; Notable for the following components:      Result Value   Creatinine, Ser 1.49 (*)    GFR, Estimated 50 (*)    All other components within normal limits  CBC  D-DIMER, QUANTITATIVE  TROPONIN I (HIGH SENSITIVITY)  TROPONIN I (HIGH SENSITIVITY)    EKG EKG Interpretation  Date/Time:  Thursday June 07 2022 10:22:59 EDT Ventricular Rate:  62 PR Interval:  128 QRS Duration: 128 QT Interval:  472 QTC Calculation: 479 R Axis:   78 Text Interpretation: Sinus rhythm with Premature atrial complexes Right bundle branch block Abnormal ECG When compared with ECG of 31-Oct-2021 15:24, rate is slower Confirmed by Aletta Edouard [phone]) on 06/07/2022 10:30:43 AM  Radiology DG Chest Port 1 View  Result Date: 06/07/2022 CLINICAL DATA:  Right lateral chest pain with shortness of breath for 2 days. History of pulmonary embolism. EXAM: PORTABLE CHEST 1 VIEW COMPARISON:  Radiographs and CT 10/31/2021. FINDINGS: 1101 hours. The heart size and mediastinal contours are stable. Previously demonstrated patchy airspace disease in superior segment of the right lower lobe has resolved, likely due to a resolving pulmonary infarct. There is mild residual density adjacent to the left lateral  costophrenic angle, which may reflect sequela of previous pulmonary embolism. No new airspace disease, edema, pleural effusion or pneumothorax. The bones appear unremarkable. Telemetry leads overlie the chest. IMPRESSION: Interval resolution of right lower lobe airspace disease, likely due to resolving pulmonary infarct. No acute cardiopulmonary process. Electronically Signed   By: Richardean Sale M.D.   On: 06/07/2022 11:19    Procedures Procedures    Medications Ordered in ED Medications - No data to display  ED Course/ Medical Decision Making/ A&P  Medical Decision Making Amount and/or Complexity of Data Reviewed Labs: ordered. Radiology: ordered.   This patient complains of right lateral chest pain ; this involves an extensive number of treatment Options and is a complaint that carries with it a high risk of complications and morbidity. The differential includes contusion, musculoskeletal, fracture, PE, pleurisy, pneumonia  I ordered, reviewed and interpreted labs, which included CBC normal chemistries normal other than chronic CKD, D-dimer negative, troponins flat I ordered imaging studies which included chest x-ray and I independently    visualized and interpreted imaging which showed resolution of prior right lower lobe density Additional history obtained from patient's wife Previous records obtained and reviewed in epic no recent admissions Cardiac monitoring reviewed, normal sinus rhythm Social determinants considered, no significant barriers Critical Interventions: None  After the interventions stated above, I reevaluated the patient and found patient to be satting well on room air no distress Admission and further testing considered, no indications for admission or further work-up at this time.  Patient comfortable plan with follow-up with his PCP and return instructions discussed.         Final Clinical Impression(s) / ED Diagnoses Final  diagnoses:  Nonspecific chest pain    Rx / DC Orders ED Discharge Orders     None         Hayden Rasmussen, MD 06/07/22 [phone]

## 2022-06-07 NOTE — ED Triage Notes (Signed)
C/o right lateral chest pain & shortness of breath x 2 days. Hx of DVT/PE in March, on eliquis.

## 2022-06-07 NOTE — ED Notes (Signed)
States two days when out walking began to have RLQ abd pain, yesterday, pain still present and had some shortness. Still has pain at rt lower chest, intermittent. Rates pain a 3 on 0-10 scale. Denies any complaints of shortness of breath. States he went on his usual walk this am without issues, with mild shortness of breath.

## 2022-06-07 NOTE — Discharge Instructions (Signed)
You were seen in the emergency department for evaluation of right-sided chest pain.  You had lab work EKG and chest x-ray that did not show any evidence of heart attack or recurrent blood clot.  Please continue your regular medications and follow-up with your primary care doctor.  Return to the emergency department if any worsening or concerning symptoms.

## 2022-10-03 IMAGING — CT CT ANGIO NECK
2 of 7 series · 8 of 33 positions shown · IV contrast (Omnipaque)
Comparison: None.

CLINICAL DATA: Neck pain



[Series 4: cta neck (person_name) · axial · 0.61mm/px · z∈[-413,-337]mm · 2 of 115 slices shown]
[im 39/115  soft-tissue]
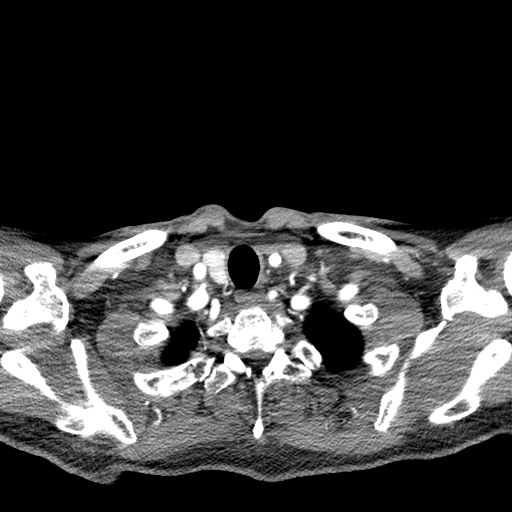
[im 77/115  soft-tissue]
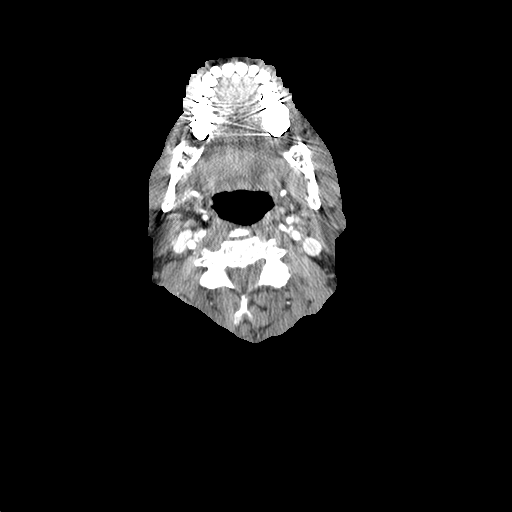

[Series 6: ax thin · axial · 0.56mm/px · z∈[-456,-294]mm · 6 of 229 slices shown]
[im 33/229  soft-tissue]
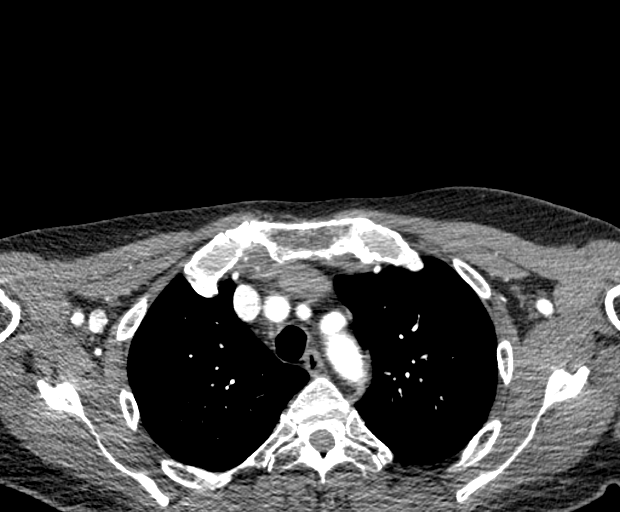
[im 66/229  bone]
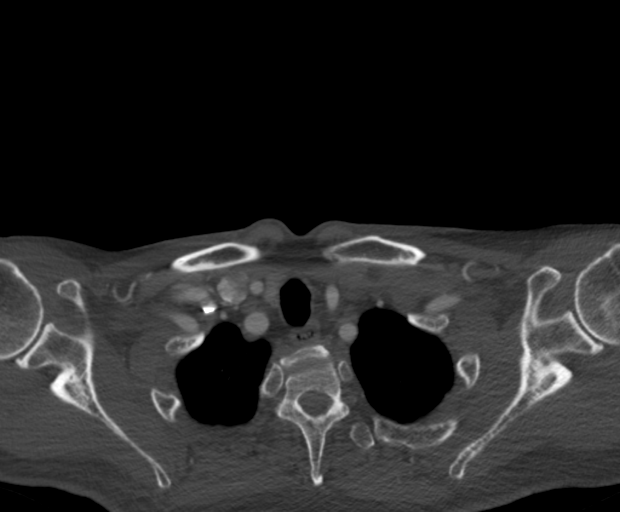
[im 98/229  soft-tissue]
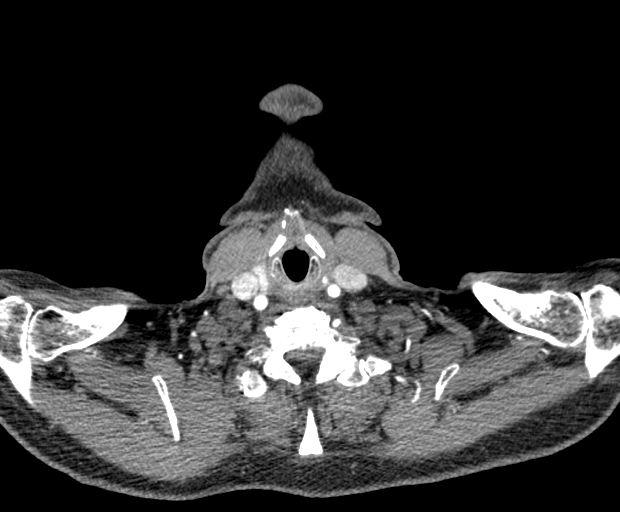
[im 131/229  bone]
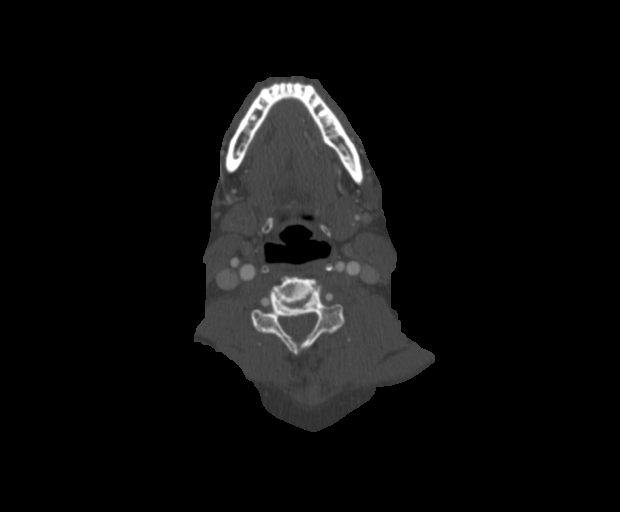
[im 163/229  soft-tissue]
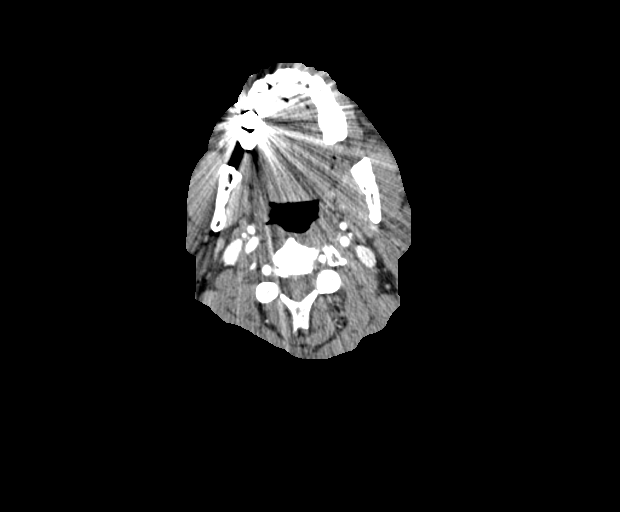
[im 196/229  bone]
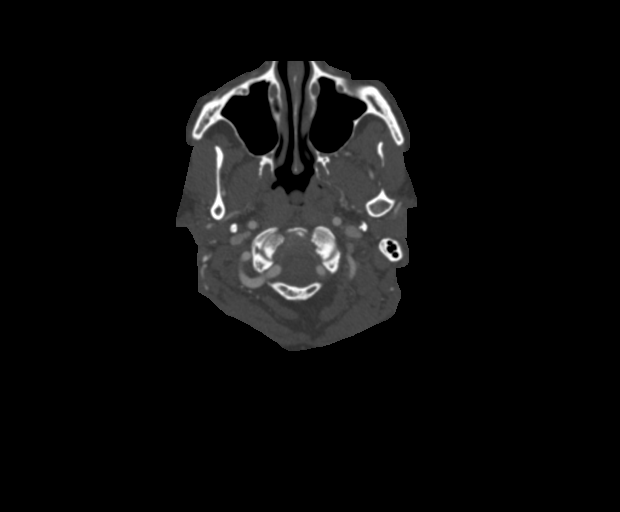

[8 of 33 positions shown; findings below may reference images not displayed]

RADIATION DOSE REDUCTION: This exam was performed according to the
departmental dose-optimization program which includes automated
exposure control, adjustment of the mA and/or kV according to
patient size and/or use of iterative reconstruction technique.

CONTRAST:  80mL OMNIPAQUE IOHEXOL 350 MG/ML SOLN
FINDINGS: Aortic arch: Great vessel origins are patent.

Right carotid system: Patent. No stenosis or evidence of dissection.

Left carotid system: Patent.  No stenosis or evidence of dissection.

Vertebral arteries: Patent. Right vertebral is mildly dominant. No
stenosis or evidence of dissection.

Skeleton: Cervical spine degenerative changes.

Other neck: Left thyroid is absent.

Upper chest: No apical lung mass.
IMPRESSION: No acute vascular abnormality. No hemodynamically significant
stenosis.
